# Patient Record
Sex: Male | Born: 1975 | Race: Black or African American | Hispanic: No | Marital: Single | Smoking: Never smoker
Health system: Southern US, Community
[De-identification: ages and names within clinical notes are randomized; demographics above are authoritative.]

## PROBLEM LIST (undated history)

## (undated) DIAGNOSIS — E785 Hyperlipidemia, unspecified: Secondary | ICD-10-CM

## (undated) DIAGNOSIS — E119 Type 2 diabetes mellitus without complications: Secondary | ICD-10-CM

## (undated) HISTORY — DX: Hyperlipidemia, unspecified: E78.5

## (undated) HISTORY — DX: Type 2 diabetes mellitus without complications: E11.9

---

## 2004-07-19 ENCOUNTER — Emergency Department: Payer: Self-pay | Admitting: Emergency Medicine

## 2005-02-08 ENCOUNTER — Emergency Department: Payer: Self-pay | Admitting: Emergency Medicine

## 2006-07-05 ENCOUNTER — Emergency Department: Payer: Self-pay | Admitting: Emergency Medicine

## 2009-07-07 ENCOUNTER — Ambulatory Visit: Payer: Self-pay | Admitting: Internal Medicine

## 2009-12-23 ENCOUNTER — Emergency Department: Payer: Self-pay | Admitting: Emergency Medicine

## 2011-09-05 ENCOUNTER — Emergency Department: Payer: Self-pay | Admitting: Emergency Medicine

## 2013-05-15 ENCOUNTER — Ambulatory Visit: Payer: Self-pay | Admitting: Family Medicine

## 2013-07-11 ENCOUNTER — Ambulatory Visit: Payer: Self-pay | Admitting: Adult Health

## 2013-09-03 ENCOUNTER — Ambulatory Visit: Payer: Self-pay | Admitting: Adult Health

## 2013-12-18 ENCOUNTER — Emergency Department: Payer: Self-pay | Admitting: Emergency Medicine

## 2013-12-18 LAB — URINALYSIS, COMPLETE
BILIRUBIN, UR: NEGATIVE
Bacteria: NONE SEEN
Glucose,UR: >=500 mg/dL (ref 0–75)
Leukocyte Esterase: NEGATIVE
Nitrite: NEGATIVE
Ph: 5 (ref 4.5–8.0)
Protein: NEGATIVE
RBC,UR: <1 /HPF (ref 0–5)
SPECIFIC GRAVITY: 1.037 (ref 1.003–1.030)
Squamous Epithelial: <1

## 2013-12-18 LAB — COMPREHENSIVE METABOLIC PANEL
ANION GAP: 4 — AB (ref 7–16)
Albumin: 4.3 g/dL (ref 3.4–5.0)
Alkaline Phosphatase: 102 U/L
BILIRUBIN TOTAL: 0.8 mg/dL (ref 0.2–1.0)
BUN: 19 mg/dL — ABNORMAL HIGH (ref 7–18)
CHLORIDE: 101 mmol/L (ref 98–107)
Calcium, Total: 8.4 mg/dL — ABNORMAL LOW (ref 8.5–10.1)
Co2: 27 mmol/L (ref 21–32)
Creatinine: 1.16 mg/dL (ref 0.60–1.30)
EGFR (African American): >60
Glucose: 344 mg/dL — ABNORMAL HIGH (ref 65–99)
Osmolality: 280 (ref 275–301)
POTASSIUM: 4.3 mmol/L (ref 3.5–5.1)
SGOT(AST): 27 U/L (ref 15–37)
SGPT (ALT): 54 U/L (ref 12–78)
SODIUM: 132 mmol/L — AB (ref 136–145)
Total Protein: 8.2 g/dL (ref 6.4–8.2)

## 2013-12-18 LAB — CBC
HCT: 47.5 % (ref 40.0–52.0)
HGB: 16.4 g/dL (ref 13.0–18.0)
MCH: 31.2 pg (ref 26.0–34.0)
MCHC: 34.5 g/dL (ref 32.0–36.0)
MCV: 90 fL (ref 80–100)
Platelet: 195 10*3/uL (ref 150–440)
RBC: 5.25 10*6/uL (ref 4.40–5.90)
RDW: 13.1 % (ref 11.5–14.5)
WBC: 12 10*3/uL — AB (ref 3.8–10.6)

## 2013-12-18 LAB — LIPASE, BLOOD: LIPASE: 106 U/L (ref 73–393)

## 2014-03-19 DIAGNOSIS — E139 Other specified diabetes mellitus without complications: Secondary | ICD-10-CM | POA: Insufficient documentation

## 2014-03-19 DIAGNOSIS — E1122 Type 2 diabetes mellitus with diabetic chronic kidney disease: Secondary | ICD-10-CM | POA: Insufficient documentation

## 2014-08-05 ENCOUNTER — Ambulatory Visit: Payer: Self-pay | Admitting: Internal Medicine

## 2015-05-19 ENCOUNTER — Encounter: Payer: Self-pay | Admitting: Family Medicine

## 2015-05-19 ENCOUNTER — Ambulatory Visit (INDEPENDENT_AMBULATORY_CARE_PROVIDER_SITE_OTHER): Payer: BLUE CROSS/BLUE SHIELD | Admitting: Family Medicine

## 2015-05-19 VITALS — BP 122/71 | HR 109 | Temp 98.3°F | Resp 18 | Ht 70.0 in | Wt 180.9 lb

## 2015-05-19 DIAGNOSIS — E785 Hyperlipidemia, unspecified: Secondary | ICD-10-CM

## 2015-05-19 DIAGNOSIS — E1165 Type 2 diabetes mellitus with hyperglycemia: Secondary | ICD-10-CM | POA: Diagnosis not present

## 2015-05-19 DIAGNOSIS — IMO0001 Reserved for inherently not codable concepts without codable children: Secondary | ICD-10-CM

## 2015-05-19 NOTE — Progress Notes (Signed)
Name: Keith Schmidt   MRN: [id number]    DOB: [birth year]   Date:05/19/2015       Progress Note  Subjective  Chief Complaint  Chief Complaint  Patient presents with  . Establish Care    NP (Dr. Carman Ching) f/u Diabetes  . Diabetes  . Hyperlipidemia    Diabetes He presents for his follow-up diabetic visit. He has type 2 diabetes mellitus. Pertinent negatives for diabetes include no chest pain. Current diabetic treatment includes oral agent (dual therapy). He is following a generally healthy diet. His lunch blood glucose range is generally >200 mg/dl. His dinner blood glucose range is generally >200 mg/dl. An ACE inhibitor/angiotensin II receptor blocker is not being taken.  Hyperlipidemia This is a chronic problem. The problem is uncontrolled. Recent lipid tests were reviewed and are high. Exacerbating diseases include diabetes. Pertinent negatives include no chest pain or myalgias. He is currently on no antihyperlipidemic treatment.      Past Medical History  Diagnosis Date  . Diabetes mellitus without complication   . Hyperlipidemia     History reviewed. No pertinent past surgical history.  History reviewed. No pertinent family history.  Social History   Social History  . Marital Status: Married    Spouse Name: N/A  . Number of Children: N/A  . Years of Education: N/A   Occupational History  . Not on file.   Social History Main Topics  . Smoking status: Never Smoker   . Smokeless tobacco: Never Used  . Alcohol Use: No  . Drug Use: No  . Sexual Activity:    Partners: Female   Other Topics Concern  . Not on file   Social History Narrative  . No narrative on file     Current outpatient prescriptions:  .  glipiZIDE (GLUCOTROL) 10 MG tablet, Take by mouth., Disp: , Rfl:  .  metFORMIN (GLUCOPHAGE) 1000 MG tablet, Take 1,000 mg by mouth., Disp: , Rfl:   Allergies no known allergies   Review of Systems  Constitutional: Negative for fever, chills and  malaise/fatigue.  Cardiovascular: Negative for chest pain and palpitations.  Musculoskeletal: Negative for myalgias.      Objective  Filed Vitals:   05/19/15 1352  BP: 122/71  Pulse: 109  Temp: 98.3 F (36.8 C)  TempSrc: Oral  Resp: 18  Height:  (1.778 m)  Weight: 180 lb 14.4 oz (82.056 kg)  SpO2: 97%    Physical Exam  Constitutional: He is oriented to person, place, and time and well-developed, well-nourished, and in no distress.  HENT:  Head: Normocephalic and atraumatic.  Cardiovascular: Normal rate and regular rhythm.   Pulmonary/Chest: Effort normal and breath sounds normal.  Abdominal: Soft. Bowel sounds are normal.  Neurological: He is alert and oriented to person, place, and time.  Skin: Skin is warm and dry.  Psychiatric: Memory, affect and judgment normal.  Nursing note and vitals reviewed.    Assessment & Plan  1. Diabetes mellitus type 2, uncontrolled, without complications Recheck A1c and adjust medications as necessary. - HgB A1c - Urine Microalbumin w/creat. ratio  2. Hyperlipidemia Patient was on a atorvastatin in the past, which was stopped due to elevation in liver enzymes. Recheck today and follow-up. - Lipid Profile - Comprehensive metabolic panel   Dania Marsan Asad [facility] 05/19/2015 1:59 PM

## 2015-06-03 LAB — LIPID PANEL
CHOL/HDL RATIO: 5.8 ratio — AB (ref 0.0–5.0)
Cholesterol, Total: 209 mg/dL — ABNORMAL HIGH (ref 100–199)
HDL: 36 mg/dL — ABNORMAL LOW (ref >39)
LDL Calculated: 159 mg/dL — ABNORMAL HIGH (ref 0–99)
TRIGLYCERIDES: 69 mg/dL (ref 0–149)
VLDL CHOLESTEROL CAL: 14 mg/dL (ref 5–40)

## 2015-06-03 LAB — COMPREHENSIVE METABOLIC PANEL
A/G RATIO: 1.8 (ref 1.1–2.5)
ALT: 45 IU/L — ABNORMAL HIGH (ref 0–44)
AST: 34 IU/L (ref 0–40)
Albumin: 4.6 g/dL (ref 3.5–5.5)
Alkaline Phosphatase: 64 IU/L (ref 39–117)
BILIRUBIN TOTAL: 0.7 mg/dL (ref 0.0–1.2)
BUN/Creatinine Ratio: 15 (ref 9–20)
BUN: 15 mg/dL (ref 6–24)
CALCIUM: 9.4 mg/dL (ref 8.7–10.2)
CHLORIDE: 97 mmol/L (ref 97–108)
CO2: 25 mmol/L (ref 18–29)
Creatinine, Ser: 0.97 mg/dL (ref 0.76–1.27)
GFR calc Af Amer: 112 mL/min/{1.73_m2} (ref >59)
GFR, EST NON AFRICAN AMERICAN: 97 mL/min/{1.73_m2} (ref >59)
Globulin, Total: 2.5 g/dL (ref 1.5–4.5)
Glucose: 204 mg/dL — ABNORMAL HIGH (ref 65–99)
POTASSIUM: 4.8 mmol/L (ref 3.5–5.2)
Sodium: 137 mmol/L (ref 134–144)
Total Protein: 7.1 g/dL (ref 6.0–8.5)

## 2015-06-03 LAB — MICROALBUMIN / CREATININE URINE RATIO
Creatinine, Urine: 192.1 mg/dL
MICROALB/CREAT RATIO: 9 mg/g creat (ref 0.0–30.0)
Microalbumin, Urine: 17.2 ug/mL

## 2015-06-03 LAB — HEMOGLOBIN A1C
Est. average glucose Bld gHb Est-mCnc: 278 mg/dL
Hgb A1c MFr Bld: 11.3 % — ABNORMAL HIGH (ref 4.8–5.6)

## 2015-06-07 ENCOUNTER — Telehealth: Payer: Self-pay | Admitting: Family Medicine

## 2015-06-07 MED ORDER — ATORVASTATIN CALCIUM 20 MG PO TABS: 20.0000 mg | ORAL_TABLET | Freq: Every day | ORAL | Status: DC

## 2015-06-07 NOTE — Telephone Encounter (Signed)
Per Dr. Sherryll Burger lipitor 20 mg has been refilled and sent to [facility] [address]

## 2015-06-07 NOTE — Addendum Note (Signed)
Addended bySherryll Burger, Nikoletta Varma A A on: 06/07/2015 03:53 PM   Modules accepted: Orders, SmartSet

## 2016-06-28 ENCOUNTER — Emergency Department: Payer: 59

## 2016-06-28 ENCOUNTER — Encounter: Payer: Self-pay | Admitting: Emergency Medicine

## 2016-06-28 ENCOUNTER — Emergency Department
Admission: EM | Admit: 2016-06-28 | Discharge: 2016-06-28 | Disposition: A | Discharge status: Home / Self Care | Payer: 59 | Attending: Emergency Medicine | Admitting: Emergency Medicine

## 2016-06-28 DIAGNOSIS — E119 Type 2 diabetes mellitus without complications: Secondary | ICD-10-CM | POA: Insufficient documentation

## 2016-06-28 DIAGNOSIS — Z7984 Long term (current) use of oral hypoglycemic drugs: Secondary | ICD-10-CM | POA: Insufficient documentation

## 2016-06-28 DIAGNOSIS — Z79899 Other long term (current) drug therapy: Secondary | ICD-10-CM | POA: Diagnosis not present

## 2016-06-28 DIAGNOSIS — R0789 Other chest pain: Secondary | ICD-10-CM | POA: Diagnosis not present

## 2016-06-28 DIAGNOSIS — R079 Chest pain, unspecified: Secondary | ICD-10-CM | POA: Diagnosis present

## 2016-06-28 LAB — CBC
HEMATOCRIT: 42.7 % (ref 40.0–52.0)
Hemoglobin: 14.8 g/dL (ref 13.0–18.0)
MCH: 30.5 pg (ref 26.0–34.0)
MCHC: 34.6 g/dL (ref 32.0–36.0)
MCV: 88.2 fL (ref 80.0–100.0)
Platelets: 215 10*3/uL (ref 150–440)
RBC: 4.85 MIL/uL (ref 4.40–5.90)
RDW: 13.1 % (ref 11.5–14.5)
WBC: 9.4 10*3/uL (ref 3.8–10.6)

## 2016-06-28 LAB — BASIC METABOLIC PANEL
Anion gap: 7 (ref 5–15)
BUN: 17 mg/dL (ref 6–20)
CHLORIDE: 106 mmol/L (ref 101–111)
CO2: 24 mmol/L (ref 22–32)
Calcium: 8.8 mg/dL — ABNORMAL LOW (ref 8.9–10.3)
Creatinine, Ser: 1.07 mg/dL (ref 0.61–1.24)
GFR calc Af Amer: >60 mL/min (ref >60)
GFR calc non Af Amer: >60 mL/min (ref >60)
Glucose, Bld: 255 mg/dL — ABNORMAL HIGH (ref 65–99)
POTASSIUM: 3.8 mmol/L (ref 3.5–5.1)
SODIUM: 137 mmol/L (ref 135–145)

## 2016-06-28 LAB — TROPONIN I: Troponin I: <0.03 ng/mL (ref <0.03)

## 2016-06-28 NOTE — ED Provider Notes (Signed)
[facility] Emergency Department Provider Note  ____________________________________________  Time seen: Approximately 6:26 PM  I have reviewed the triage vital signs and the nursing notes.   HISTORY  Chief Complaint Chest Pain    HPI Keith Schmidt is a 40 y.o. male who complains of chest pain for the past week. it's intermittent, lasting a few seconds or up to 2 minutes at a time, no aggravating or alleviating factors, not exertional, not pleuritic. Sharp. Nonradiating in the left side of the chest. No nausea vomiting or diaphoresis. He does occasionally have some shortness of breath with it.   Past Medical History:  Diagnosis Date  . Diabetes mellitus without complication (HCC)   . Hyperlipidemia      Patient Active Problem List   Diagnosis Date Noted  . Diabetes mellitus type 2, uncontrolled, without complications (HCC) 05/19/2015  . Hyperlipidemia 05/19/2015     History reviewed. No pertinent surgical history.   Prior to Admission medications   Medication Sig Start Date End Date Taking? Authorizing Provider  atorvastatin (LIPITOR) 20 MG tablet Take 1 tablet (20 mg total) by mouth at bedtime. 06/07/15   Ellyn Hack, MD  glipiZIDE (GLUCOTROL) 10 MG tablet Take by mouth. 09/23/14   Historical Provider, MD  metFORMIN (GLUCOPHAGE) 1000 MG tablet Take 1,000 mg by mouth.    Historical Provider, MD     Allergies Review of patient's allergies indicates no known allergies.   No family history on file. No early CAD Social History Social History  Substance Use Topics  . Smoking status: Never Smoker  . Smokeless tobacco: Never Used  . Alcohol use No    Review of Systems  Constitutional:   No fever or chills.  ENT:   No sore throat. No rhinorrhea. Cardiovascular:   Positive as above chest pain. Respiratory:   No dyspnea or cough. Gastrointestinal:   Negative for abdominal pain, vomiting and diarrhea.   10-point ROS otherwise  negative.  ____________________________________________   PHYSICAL EXAM:  VITAL SIGNS: ED Triage Vitals  Enc Vitals Group     BP 06/28/16 1708 114/64     Pulse Rate 06/28/16 1708 78     Resp 06/28/16 1708 18     Temp 06/28/16 1708 98.1 F (36.7 C)     Temp Source 06/28/16 1708 Oral     SpO2 06/28/16 1708 98 %     Weight 06/28/16 1709 175 lb (79.4 kg)     Height 06/28/16 1709 5\' 8"  (1.727 m)     Head Circumference --      Peak Flow --      Pain Score 06/28/16 1709 5     Pain Loc --      Pain Edu? --      Excl. in GC? --     Vital signs reviewed, nursing assessments reviewed.   Constitutional:   Alert and oriented. Well appearing and in no distress. Eyes:   No scleral icterus. No conjunctival pallor. PERRL. EOMI.  No nystagmus. ENT   Head:   Normocephalic and atraumatic.   Nose:   No congestion/rhinnorhea. No septal hematoma   Mouth/Throat:   MMM, no pharyngeal erythema. No peritonsillar mass.    Neck:   No stridor. No SubQ emphysema. No meningismus. Hematological/Lymphatic/Immunilogical:   No cervical lymphadenopathy. Cardiovascular:   RRR. Symmetric bilateral radial and DP pulses.  No murmurs.  Respiratory:   Normal respiratory effort without tachypnea nor retractions. Breath sounds are clear and equal bilaterally. No wheezes/rales/rhonchi.Chest  wall nontender Gastrointestinal:   Soft and nontender. Non distended. There is no CVA tenderness.  No rebound, rigidity, or guarding. Genitourinary:   deferred Musculoskeletal:   Nontender with normal range of motion in all extremities. No joint effusions.  No lower extremity tenderness.  No edema. Neurologic:   Normal speech and language.  CN 2-10 normal. Motor grossly intact. No gross focal neurologic deficits are appreciated.  Skin:    Skin is warm, dry and intact. No rash noted.  No petechiae, purpura, or bullae.  ____________________________________________    LABS (pertinent positives/negatives) (all  labs ordered are listed, but only abnormal results are displayed) Labs Reviewed  BASIC METABOLIC PANEL - Abnormal; Notable for the following:       Result Value   Glucose, Bld 255 (*)    Calcium 8.8 (*)    All other components within normal limits  CBC  TROPONIN I   ____________________________________________   EKG  Interpreted by me  Date: 06/28/2016  Rate: 61  Rhythm: normal sinus rhythm  QRS Axis: normal  Intervals: normal  ST/T Wave abnormalities: normal  Conduction Disutrbances: none  Narrative Interpretation: unremarkable      ____________________________________________    RADIOLOGY  Chest x-ray unremarkable  ____________________________________________   PROCEDURES Procedures  ____________________________________________   INITIAL IMPRESSION / ASSESSMENT AND PLAN / ED COURSE  Pertinent labs & imaging results that were available during my care of the patient were reviewed by me and considered in my medical decision making (see chart for details).  Patient is well-appearing no acute distress, presents with atypical chest pain.Considering the patient's symptoms, medical history, and physical examination today, I have low suspicion for ACS, PE, TAD, pneumothorax, carditis, mediastinitis, pneumonia, CHF, or sepsis.  Although his presentation is not consistent with ACS and chest pains currently resolved, troponin negative, EKG unremarkable, due to his risk factors of hyperlipidemia and diabetes I encouraged the patient follow up with cardiology within a week for further assessment risk stratification and potential testing. Does not warrant hospitalization at this time.     Clinical Course   ____________________________________________   FINAL CLINICAL IMPRESSION(S) / ED DIAGNOSES  Final diagnoses:  Atypical chest pain       Portions of this note were generated with dragon dictation software. Dictation errors may occur despite best attempts at  proofreading.    Sharman CheekPhillip Arla Boutwell, MD 06/28/16 [phone]

## 2016-06-28 NOTE — ED Triage Notes (Signed)
Pt presents with non radiating left side chest pain for approximately one week.

## 2017-01-22 ENCOUNTER — Encounter: Payer: Self-pay | Admitting: Family Medicine

## 2017-01-22 ENCOUNTER — Ambulatory Visit (INDEPENDENT_AMBULATORY_CARE_PROVIDER_SITE_OTHER): Payer: 59 | Admitting: Family Medicine

## 2017-01-22 VITALS — BP 116/72 | HR 75 | Temp 98.7°F | Ht 68.5 in | Wt 172.7 lb

## 2017-01-22 DIAGNOSIS — E782 Mixed hyperlipidemia: Secondary | ICD-10-CM

## 2017-01-22 DIAGNOSIS — K649 Unspecified hemorrhoids: Secondary | ICD-10-CM | POA: Diagnosis not present

## 2017-01-22 DIAGNOSIS — IMO0001 Reserved for inherently not codable concepts without codable children: Secondary | ICD-10-CM

## 2017-01-22 DIAGNOSIS — E1165 Type 2 diabetes mellitus with hyperglycemia: Secondary | ICD-10-CM | POA: Diagnosis not present

## 2017-01-22 DIAGNOSIS — Z7689 Persons encountering health services in other specified circumstances: Secondary | ICD-10-CM | POA: Diagnosis not present

## 2017-01-22 DIAGNOSIS — R350 Frequency of micturition: Secondary | ICD-10-CM

## 2017-01-22 DIAGNOSIS — Z23 Encounter for immunization: Secondary | ICD-10-CM

## 2017-01-22 LAB — UA/M W/RFLX CULTURE, ROUTINE
Bilirubin, UA: NEGATIVE
Leukocytes, UA: NEGATIVE
Nitrite, UA: NEGATIVE
RBC, UA: NEGATIVE
Specific Gravity, UA: 1.025 (ref 1.005–1.030)
Urobilinogen, Ur: 0.2 mg/dL (ref 0.2–1.0)
pH, UA: 5 (ref 5.0–7.5)

## 2017-01-22 LAB — MICROSCOPIC EXAMINATION
Bacteria, UA: NONE SEEN
RBC, UA: NONE SEEN /hpf (ref 0–?)
WBC, UA: NONE SEEN /hpf (ref 0–?)

## 2017-01-22 LAB — BAYER DCA HB A1C WAIVED: HB A1C (BAYER DCA - WAIVED): >14 % — ABNORMAL HIGH (ref <7.0)

## 2017-01-22 MED ORDER — HYDROCORTISONE 2.5 % RE CREA: 1.0000 "application " | TOPICAL_CREAM | Freq: Two times a day (BID) | RECTAL | 1 refills | Status: DC

## 2017-01-22 MED ORDER — GLIPIZIDE 10 MG PO TABS: 10.0000 mg | ORAL_TABLET | Freq: Every day | ORAL | 3 refills | Status: DC

## 2017-01-22 MED ORDER — ATORVASTATIN CALCIUM 20 MG PO TABS: 20.0000 mg | ORAL_TABLET | Freq: Every day | ORAL | 3 refills | Status: DC

## 2017-01-22 MED ORDER — METFORMIN HCL 1000 MG PO TABS: 1000.0000 mg | ORAL_TABLET | Freq: Two times a day (BID) | ORAL | 3 refills | Status: AC

## 2017-01-22 MED ORDER — GLIPIZIDE 10 MG PO TABS: 10.0000 mg | ORAL_TABLET | Freq: Two times a day (BID) | ORAL | 3 refills | Status: AC

## 2017-01-22 NOTE — Progress Notes (Addendum)
BP 116/72 (BP Location: Right Arm, Patient Position: Sitting, Cuff Size: Normal)   Pulse 75   Temp 98.7 F (37.1 C)   Ht 5' 8.5" (1.74 m)   Wt 172 lb 11.2 oz (78.3 kg)   SpO2 98%   BMI 25.88 kg/m    Subjective:    Patient ID: Keith Schmidt, male    DOB: [birth year], 41 y.o.   MRN: [id number]  HPI: Keith Schmidt is a 41 y.o. male  Chief Complaint  Patient presents with  . Establish Care  . Urinary Frequency    x's 1 year, 2 months.  . Joint Pain    Both hands. x's 1 year.   . Foot Pain    Left foot. Cramping. x's 3 months.    Patient presents today to establish care. Last visit with previous PCP was back in September. Was given refills on all medications at this visit which he was on for a month, but then never refilled them after that. Has been off everything x 6 months. A1c has consistently been above 11. 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   Urinary frequency - 4 or 5 times per night, and 7-8 times per day. Hx of prostate infections x 2 (age 81 and age 61), and fhx of prostate cancer. States he is having rectal pain x 1 week, but also recently noticed a new mass on anus so wondering whether it's his prostate or something more superficial. Aches when sitting and with BMs. Has not tried anything OTC for relief. No hematuria, dysuria, fevers, chills. No hx of trauma, hemorrhoids, fissures, or other anal issues. Does admit to irregular BMs and occasional straining. Denies bloody stools.   Relevant past medical, surgical, family and social history reviewed and updated as indicated. Interim medical history since our last visit reviewed. Allergies and medications reviewed and updated.  Review of Systems  Constitutional: Negative.   HENT: Negative.   Eyes: Negative.   Respiratory: Negative.   Cardiovascular: Negative.   Gastrointestinal: Positive for constipation and rectal pain.  Endocrine:  Positive for polyuria.  Genitourinary: Negative.   Musculoskeletal: Negative.   Skin: Negative.   Neurological: Negative.   Psychiatric/Behavioral: Negative.     Per HPI unless specifically indicated above     Objective:    BP 116/72 (BP Location: Right Arm, Patient Position: Sitting, Cuff Size: Normal)   Pulse 75   Temp 98.7 F (37.1 C)   Ht 5' 8.5" (1.74 m)   Wt 172 lb 11.2 oz (78.3 kg)   SpO2 98%   BMI 25.88 kg/m   Wt Readings from Last 3 Encounters:  01/22/17 172 lb 11.2 oz (78.3 kg)  06/28/16 175 lb (79.4 kg)  05/19/15 180 lb 14.4 oz (82.1 kg)    Physical Exam  Constitutional: He is oriented to person, place, and time. He appears well-developed and well-nourished. No distress.  HENT:  Head: Atraumatic.  Eyes: Conjunctivae are normal. Pupils are equal, round, and reactive to light. No scleral icterus.  Neck: Normal range of motion. Neck supple.  Cardiovascular: Normal rate and normal heart sounds.   Pulmonary/Chest: Effort normal and breath sounds normal. No respiratory distress.  Abdominal: Soft. Bowel sounds are normal.  Genitourinary: Prostate normal.  Genitourinary Comments: Inflamed hemorrhoid at 9' o clock, non-thrombosed, no fissure noted  Prostate non-tender to palpation  Musculoskeletal: Normal range of motion.  Neurological: He is alert and  oriented to person, place, and time.  Skin:  Significant, diffuse ichthyosis    Psychiatric: He has a normal mood and affect. His behavior is normal.  Nursing note and vitals reviewed.     Assessment & Plan:   Problem List Items Addressed This Visit      Endocrine   Diabetes mellitus type 2, uncontrolled, without complications (HCC)    A1C > 14 today. Restart metformin and glipizide, discussed diet and exercise, endocrinology referral placed. Pt aware that it is incredibly important to be compliant with these medications and to keep this appointment in order to get his sugars under control. Suspect his urinary  symptoms are d/t severely uncontrolled dz.        Relevant Medications   metFORMIN (GLUCOPHAGE) 1000 MG tablet   atorvastatin (LIPITOR) 20 MG tablet   glipiZIDE (GLUCOTROL) 10 MG tablet   Other Relevant Orders   UA/M w/rflx Culture, Routine (Completed)   Bayer DCA Hb A1c Waived (Completed)   Ambulatory referral to Endocrinology     Other   Hyperlipidemia    Restart atorvastatin, f/u for fasting lipid check in 6 months. Discussed lifestyle modifications.       Relevant Medications   atorvastatin (LIPITOR) 20 MG tablet   Other Relevant Orders   Lipid Panel w/o Chol/HDL Ratio (Completed)    Other Visit Diagnoses    Encounter to establish care    -  Primary   Need for tetanus, diphtheria, and acellular pertussis (Tdap) vaccine       Relevant Orders   Tdap vaccine greater than or equal to 7yo IM (Completed)   Urinary frequency       Await PSA given fhx, U/A without evidence of infection, prostate exam normal. Suspect related to uncontrolled blood sugars.    Relevant Orders   PSA (Completed)   Hemorrhoids, unspecified hemorrhoid type       Anusol sent, discussed miralax and fiber daily to keep stools soft, avoid straining. Tucks pads, sitz baths. F/u if worsening or no improvement.    Relevant Medications   atorvastatin (LIPITOR) 20 MG tablet       Follow up plan: Return in about 6 months (around 07/24/2017) for Physical Exam, HLD.

## 2017-01-22 NOTE — Patient Instructions (Signed)
Follow up in 6 months 

## 2017-01-23 LAB — LIPID PANEL W/O CHOL/HDL RATIO
Cholesterol, Total: 220 mg/dL — ABNORMAL HIGH (ref 100–199)
HDL: 32 mg/dL — ABNORMAL LOW (ref >39)
LDL CALC: 166 mg/dL — AB (ref 0–99)
Triglycerides: 111 mg/dL (ref 0–149)
VLDL Cholesterol Cal: 22 mg/dL (ref 5–40)

## 2017-01-23 LAB — PSA: Prostate Specific Ag, Serum: 1.5 ng/mL (ref 0.0–4.0)

## 2017-01-24 NOTE — Assessment & Plan Note (Signed)
Restart atorvastatin, f/u for fasting lipid check in 6 months. Discussed lifestyle modifications.

## 2017-01-24 NOTE — Assessment & Plan Note (Signed)
A1C > 14 today. Restart metformin and glipizide, discussed diet and exercise, endocrinology referral placed. Pt aware that it is incredibly important to be compliant with these medications and to keep this appointment in order to get his sugars under control. Suspect his urinary symptoms are d/t severely uncontrolled dz.

## 2017-07-30 ENCOUNTER — Encounter: Payer: Self-pay | Admitting: Family Medicine

## 2017-08-15 ENCOUNTER — Emergency Department: Payer: 59

## 2017-08-15 ENCOUNTER — Emergency Department
Admission: EM | Admit: 2017-08-15 | Discharge: 2017-08-15 | Disposition: A | Discharge status: Home / Self Care | Payer: Self-pay | Attending: Emergency Medicine | Admitting: Emergency Medicine

## 2017-08-15 DIAGNOSIS — E119 Type 2 diabetes mellitus without complications: Secondary | ICD-10-CM | POA: Insufficient documentation

## 2017-08-15 DIAGNOSIS — M722 Plantar fascial fibromatosis: Secondary | ICD-10-CM | POA: Insufficient documentation

## 2017-08-15 DIAGNOSIS — Z7984 Long term (current) use of oral hypoglycemic drugs: Secondary | ICD-10-CM | POA: Insufficient documentation

## 2017-08-15 DIAGNOSIS — Z79899 Other long term (current) drug therapy: Secondary | ICD-10-CM | POA: Insufficient documentation

## 2017-08-15 LAB — GLUCOSE, CAPILLARY: Glucose-Capillary: 363 mg/dL — ABNORMAL HIGH (ref 65–99)

## 2017-08-15 MED ORDER — MELOXICAM 15 MG PO TABS: 15.0000 mg | ORAL_TABLET | Freq: Every day | ORAL | 0 refills | Status: DC

## 2017-08-15 MED ORDER — HYDROCODONE-ACETAMINOPHEN 5-325 MG PO TABS: 1.0000 | ORAL_TABLET | ORAL | 0 refills | Status: AC | PRN

## 2017-08-15 NOTE — ED Notes (Signed)
Left foot pain x3 days, no injury

## 2017-08-15 NOTE — ED Provider Notes (Signed)
[facility] Emergency Department Provider Note ____________________________________________  Time seen: Approximately 6:14 PM  I have reviewed the triage vital signs and the nursing notes.   HISTORY  Chief Complaint Foot Pain    HPI Nobie PutnamKenneth J Niccoli is a 41 y.o. male who presents to the emergency department for evaluation and treatment of the left foot pain.Pain started approximately 3 days ago. No specific injury. He does work in a [employer] and walks many hours on concrete floors. He states he has been walking on the ball of his foot to avoid putting any weight on the heel. Pain is described as a throbbing, dull, and occasionally sharp pain. He does have a significant past medical history of type 2 diabetes which has been uncontrolled for some time. He reports that his last A1c was 14. He reports being compliant with his medications.  Past Medical History:  Diagnosis Date  . Diabetes mellitus without complication (HCC)   . Hyperlipidemia     Patient Active Problem List   Diagnosis Date Noted  . Diabetes mellitus type 2, uncontrolled, without complications (HCC) 05/19/2015  . Hyperlipidemia 05/19/2015    No past surgical history on file.  Prior to Admission medications   Medication Sig Start Date End Date Taking? Authorizing Provider  atorvastatin (LIPITOR) 20 MG tablet Take 1 tablet (20 mg total) by mouth at bedtime. 01/22/17   Particia NearingLane, Rachel Elizabeth, PA-C  glipiZIDE (GLUCOTROL) 10 MG tablet Take 1 tablet (10 mg total) by mouth 2 (two) times daily before a meal. 01/22/17   Particia NearingLane, Rachel Elizabeth, PA-C  HYDROcodone-acetaminophen (NORCO/VICODIN) 5-325 MG tablet Take 1 tablet every 4 (four) hours as needed by mouth for moderate pain. 08/15/17 08/15/18  Macil Crady, Kasandra Knudsenari B, FNP  hydrocortisone (ANUSOL-HC) 2.5 % rectal cream Place 1 application rectally 2 (two) times daily. 01/22/17   Particia NearingLane, Rachel Elizabeth, PA-C  meloxicam (MOBIC) 15 MG tablet Take 1 tablet (15  mg total) daily by mouth. 08/15/17   Chinita Pesterriplett, Emmaline Wahba B, FNP  metFORMIN (GLUCOPHAGE) 1000 MG tablet Take 1 tablet (1,000 mg total) by mouth 2 (two) times daily with a meal. 01/22/17   Particia NearingLane, Rachel Elizabeth, PA-C    Allergies Atorvastatin  Family History  Problem Relation Age of Onset  . Diabetes Mother   . Depression Maternal Aunt   . Depression Maternal Uncle   . Depression Maternal Grandmother   . Arthritis Maternal Grandmother   . Birth defects Maternal Grandmother        Pancreatic  . Heart disease Maternal Grandfather   . Depression Paternal Grandmother   . Arthritis Paternal Grandmother     Social History Social History   Tobacco Use  . Smoking status: Never Smoker  . Smokeless tobacco: Never Used  Substance Use Topics  . Alcohol use: No    Alcohol/week: 0.0 oz  . Drug use: No    Review of Systems Constitutional: Negative for fever, recent injury or illness. Cardiovascular: Negative for chest pain. Respiratory: Negative for shortness of breath. Musculoskeletal: Positive for left foot pain. Skin: Negative for rash, lesion, or when.  Neurological: Negative for change in cognition or mentation.  ____________________________________________   PHYSICAL EXAM:  VITAL SIGNS: ED Triage Vitals [08/15/17 1808]  Enc Vitals Group     BP (!) 124/94     Pulse Rate 69     Resp 16     Temp 98 F (36.7 C)     Temp src      SpO2 100 %  Weight      Height      Head Circumference      Peak Flow      Pain Score      Pain Loc      Pain Edu?      Excl. in GC?     Constitutional: Alert and oriented. Well appearing and in no acute distress. Eyes: Conjunctivae are clear without discharge or drainage.  Head: Atraumatic Neck: Full, active range of motion observed. Respiratory: Respirations are even and unlabored. Musculoskeletal: No obvious bony abnormality of the left foot or ankle. Patient has full range of motion of toes of the left foot as well as ankle. Tenderness  upon deep palpation of the left calcaneus. Neurologic: Awake, alert, oriented 4.  Skin: Warm, dry, and intact. No lesions or wounds noted over the left foot or toes.  Psychiatric: Mood, affect, and behavior are normal and appropriate.  ____________________________________________   LABS (all labs ordered are listed, but only abnormal results are displayed)  Labs Reviewed  GLUCOSE, CAPILLARY - Abnormal; Notable for the following components:      Result Value   Glucose-Capillary 363 (*)    All other components within normal limits   ____________________________________________  RADIOLOGY  Image of the left foot is negative for acute bony abnormality. ____________________________________________   PROCEDURES  Procedure(s) performed: None  ____________________________________________   INITIAL IMPRESSION / ASSESSMENT AND PLAN / ED COURSE  Nobie PutnamKenneth J Berninger is a 41 y.o. male who presents to the emergency department for treatment and evaluation of nontraumatic left foot pain started 3 days ago. Symptoms most consistent with plantar fasciitis, however x-rays have been requested to look for subtle/stress fracture or foreign body.  ----------------------------------------- 7:30 PM on 08/15/2017 ----------------------------------------- X-ray negative for acute bony abnormality and no foreign bodies identified. Patient will be discharged home with prescriptions for meloxicam and a short course of Norco. He was also instructed to rest and elevate his foot as often as possible for the next 3 days and a work note was provided. He was instructed to follow-up with podiatry for both his diabetes and acute pain. He was advised to return to the emergency department for symptoms that change or worsen if he is unable to see his primary care provider or the specialist.   Pertinent labs & imaging results that were available during my care of the patient were reviewed by me and considered in my  medical decision making (see chart for details).  _________________________________________   FINAL CLINICAL IMPRESSION(S) / ED DIAGNOSES  Final diagnoses:  Plantar fasciitis of left foot    If controlled substance prescribed during this visit, 12 month history viewed on the [facility] prior to issuing an initial prescription for Schedule II or III opiod.    Chinita Pesterriplett, Thressa Shiffer B, FNP 08/15/17 2021    Dionne BucySiadecki, Sebastian, MD 08/15/17 2316

## 2017-08-15 NOTE — Discharge Instructions (Signed)
Please follow-up with podiatry if you're not improving over the week. Return to emergency department for symptoms that change or worsen if you're unable schedule an appointment.

## 2017-08-15 NOTE — ED Notes (Signed)
Pt presents today with  Foot pain. Pt is a diabetic and has had this foot pain since Sunday. He currently is only taking OTC for the pain. Pt is A/O wife at bedside. Awaiting EDP.

## 2017-12-26 ENCOUNTER — Other Ambulatory Visit: Payer: Self-pay

## 2017-12-26 ENCOUNTER — Emergency Department
Admission: EM | Admit: 2017-12-26 | Discharge: 2017-12-26 | Disposition: A | Discharge status: Home / Self Care | Payer: Self-pay | Attending: Emergency Medicine | Admitting: Emergency Medicine

## 2017-12-26 DIAGNOSIS — Z7984 Long term (current) use of oral hypoglycemic drugs: Secondary | ICD-10-CM | POA: Insufficient documentation

## 2017-12-26 DIAGNOSIS — E119 Type 2 diabetes mellitus without complications: Secondary | ICD-10-CM | POA: Insufficient documentation

## 2017-12-26 DIAGNOSIS — H66001 Acute suppurative otitis media without spontaneous rupture of ear drum, right ear: Secondary | ICD-10-CM | POA: Insufficient documentation

## 2017-12-26 DIAGNOSIS — Z79899 Other long term (current) drug therapy: Secondary | ICD-10-CM | POA: Insufficient documentation

## 2017-12-26 MED ORDER — AMOXICILLIN 500 MG PO CAPS: 500.0000 mg | ORAL_CAPSULE | Freq: Three times a day (TID) | ORAL | 0 refills | Status: AC

## 2017-12-26 MED ORDER — PREDNISONE 50 MG PO TABS: ORAL_TABLET | ORAL | 0 refills | Status: DC

## 2017-12-26 NOTE — ED Triage Notes (Signed)
Right ear pain and decreased hearing X 1 week .Pt alert and oriented X4, active, cooperative, pt in NAD. RR even and unlabored, color WNL.

## 2017-12-26 NOTE — ED Notes (Signed)
See triage note  Presents with pain and decreased hearing to right ear  States he thought he had an ear infection about 1 week ago   But now noticed decreased hearing

## 2017-12-26 NOTE — ED Provider Notes (Signed)
[facility] Emergency Department Provider Note  ____________________________________________  Time seen: Approximately 6:12 PM  I have reviewed the triage vital signs and the nursing notes.   HISTORY  Chief Complaint Otalgia    HPI Keith Schmidt is a 42 y.o. male presents to the emergency department with 8 out of 10, nonradiating right ear pain for the past 5 days.  Patient reports that he has not had an ear infection since childhood.  He denies discharge from the ear.  Patient reports no fever or chills.  Patient reports that he has noticed a loss of hearing.  No alleviating measures have been attempted.  Past Medical History:  Diagnosis Date  . Diabetes mellitus without complication (HCC)   . Hyperlipidemia     Patient Active Problem List   Diagnosis Date Noted  . Diabetes mellitus type 2, uncontrolled, without complications (HCC) 05/19/2015  . Hyperlipidemia 05/19/2015    History reviewed. No pertinent surgical history.  Prior to Admission medications   Medication Sig Start Date End Date Taking? Authorizing Provider  amoxicillin (AMOXIL) 500 MG capsule Take 1 capsule (500 mg total) by mouth 3 (three) times daily for 10 days. 12/26/17 01/05/18  Orvil FeilWoods, Jaclyn M, PA-C  atorvastatin (LIPITOR) 20 MG tablet Take 1 tablet (20 mg total) by mouth at bedtime. 01/22/17   Particia NearingLane, Rachel Elizabeth, PA-C  glipiZIDE (GLUCOTROL) 10 MG tablet Take 1 tablet (10 mg total) by mouth 2 (two) times daily before a meal. 01/22/17   Particia NearingLane, Rachel Elizabeth, PA-C  HYDROcodone-acetaminophen (NORCO/VICODIN) 5-325 MG tablet Take 1 tablet every 4 (four) hours as needed by mouth for moderate pain. 08/15/17 08/15/18  Triplett, Kasandra Knudsenari B, FNP  hydrocortisone (ANUSOL-HC) 2.5 % rectal cream Place 1 application rectally 2 (two) times daily. 01/22/17   Particia NearingLane, Rachel Elizabeth, PA-C  meloxicam (MOBIC) 15 MG tablet Take 1 tablet (15 mg total) daily by mouth. 08/15/17   Triplett, Rulon Eisenmengerari B, FNP   metFORMIN (GLUCOPHAGE) 1000 MG tablet Take 1 tablet (1,000 mg total) by mouth 2 (two) times daily with a meal. 01/22/17   Particia NearingLane, Rachel Elizabeth, PA-C  predniSONE (DELTASONE) 50 MG tablet Take one 50 mg tablet once daily for 5 days. 12/26/17   Orvil FeilWoods, Jaclyn M, PA-C    Allergies Atorvastatin  Family History  Problem Relation Age of Onset  . Diabetes Mother   . Depression Maternal Aunt   . Depression Maternal Uncle   . Depression Maternal Grandmother   . Arthritis Maternal Grandmother   . Birth defects Maternal Grandmother        Pancreatic  . Heart disease Maternal Grandfather   . Depression Paternal Grandmother   . Arthritis Paternal Grandmother     Social History Social History   Tobacco Use  . Smoking status: Never Smoker  . Smokeless tobacco: Never Used  Substance Use Topics  . Alcohol use: No    Alcohol/week: 0.0 oz  . Drug use: No     Review of Systems  Constitutional: No fever/chills Eyes: No visual changes. No discharge ENT: Patient has right otalgia. Cardiovascular: no chest pain. Respiratory: no cough. No SOB. Gastrointestinal: No abdominal pain.  No nausea, no vomiting.  No diarrhea.  No constipation. Musculoskeletal: Negative for musculoskeletal pain. Skin: Negative for rash, abrasions, lacerations, ecchymosis. Neurological: Negative for headaches, focal weakness or numbness. 10-point ROS otherwise negative. ____________________________________________   PHYSICAL EXAM:  VITAL SIGNS: ED Triage Vitals  Enc Vitals Group     BP 12/26/17 1737 111/74  Pulse Rate 12/26/17 1737 73     Resp --      Temp 12/26/17 1737 98.3 F (36.8 C)     Temp Source 12/26/17 1737 Oral     SpO2 12/26/17 1737 97 %     Weight 12/26/17 1737 174 lb (78.9 kg)     Height 12/26/17 1737 5\' 8"  (1.727 m)     Head Circumference --      Peak Flow --      Pain Score 12/26/17 1745 5     Pain Loc --      Pain Edu? --      Excl. in GC? --      Constitutional: Alert and  oriented. Well appearing and in no acute distress. Eyes: Conjunctivae are normal. PERRL. EOMI. Head: Atraumatic. ENT:      Ears: Right tympanic membrane is erythematous, bulging and effused.      Nose: No congestion/rhinnorhea.      Mouth/Throat: Mucous membranes are moist.  Hematological/Lymphatic/Immunilogical: No cervical lymphadenopathy. Cardiovascular: Normal rate, regular rhythm. Normal S1 and S2.  Good peripheral circulation. Respiratory: Normal respiratory effort without tachypnea or retractions. Lungs CTAB. Good air entry to the bases with no decreased or absent breath sounds. Skin:  Skin is warm, dry and intact. No rash noted.  ____________________________________________   LABS (all labs ordered are listed, but only abnormal results are displayed)  Labs Reviewed - No data to display ____________________________________________  EKG   ____________________________________________  RADIOLOGY   No results found.  ____________________________________________    PROCEDURES  Procedure(s) performed:    Procedures    Medications - No data to display   ____________________________________________   INITIAL IMPRESSION / ASSESSMENT AND PLAN / ED COURSE  Pertinent labs & imaging results that were available during my care of the patient were reviewed by me and considered in my medical decision making (see chart for details).  Review of the [place] CSRS was performed in accordance of the [facility] prior to dispensing any controlled drugs.     Assessment and plan Otitis media Patient presents to the emergency department with right otalgia for approximately 1 week.  Differential diagnosis included otitis externa and otitis media.  On physical exam, patient had otitis media with effusion.  Patient was discharged with amoxicillin and advised to use Flonase at the same time.  Patient was also given a prescription for prednisone but advised to only start prednisone if hearing  loss does not improve secondary to persistent middle ear effusion.     ____________________________________________  FINAL CLINICAL IMPRESSION(S) / ED DIAGNOSES  Final diagnoses:  Acute suppurative otitis media of right ear without spontaneous rupture of tympanic membrane, recurrence not specified      NEW MEDICATIONS STARTED DURING THIS VISIT:  ED Discharge Orders        Ordered    amoxicillin (AMOXIL) 500 MG capsule  3 times daily     12/26/17 1810    predniSONE (DELTASONE) 50 MG tablet     12/26/17 1810          This chart was dictated using voice recognition software/Dragon. Despite best efforts to proofread, errors can occur which can change the meaning. Any change was purely unintentional.    Orvil Feil, PA-C 12/26/17 1816    Rockne Menghini, MD 12/26/17 [phone]

## 2018-09-17 ENCOUNTER — Emergency Department
Admission: EM | Admit: 2018-09-17 | Discharge: 2018-09-17 | Disposition: A | Discharge status: Home / Self Care | Payer: Self-pay | Attending: Student in an Organized Health Care Education/Training Program | Admitting: Student in an Organized Health Care Education/Training Program

## 2018-09-17 ENCOUNTER — Encounter: Payer: Self-pay | Admitting: Emergency Medicine

## 2018-09-17 ENCOUNTER — Other Ambulatory Visit: Payer: Self-pay

## 2018-09-17 DIAGNOSIS — R739 Hyperglycemia, unspecified: Secondary | ICD-10-CM

## 2018-09-17 DIAGNOSIS — H538 Other visual disturbances: Secondary | ICD-10-CM | POA: Insufficient documentation

## 2018-09-17 DIAGNOSIS — R110 Nausea: Secondary | ICD-10-CM | POA: Insufficient documentation

## 2018-09-17 DIAGNOSIS — R5383 Other fatigue: Secondary | ICD-10-CM | POA: Insufficient documentation

## 2018-09-17 DIAGNOSIS — Z79899 Other long term (current) drug therapy: Secondary | ICD-10-CM | POA: Insufficient documentation

## 2018-09-17 DIAGNOSIS — E1165 Type 2 diabetes mellitus with hyperglycemia: Secondary | ICD-10-CM | POA: Insufficient documentation

## 2018-09-17 DIAGNOSIS — Z7984 Long term (current) use of oral hypoglycemic drugs: Secondary | ICD-10-CM | POA: Insufficient documentation

## 2018-09-17 LAB — BASIC METABOLIC PANEL
ANION GAP: 6 (ref 5–15)
BUN: 15 mg/dL (ref 6–20)
CHLORIDE: 106 mmol/L (ref 98–111)
CO2: 24 mmol/L (ref 22–32)
Calcium: 8.7 mg/dL — ABNORMAL LOW (ref 8.9–10.3)
Creatinine, Ser: 0.93 mg/dL (ref 0.61–1.24)
GFR calc non Af Amer: >60 mL/min (ref >60)
Glucose, Bld: 290 mg/dL — ABNORMAL HIGH (ref 70–99)
POTASSIUM: 4.2 mmol/L (ref 3.5–5.1)
SODIUM: 136 mmol/L (ref 135–145)

## 2018-09-17 LAB — CBC
HEMATOCRIT: 46.2 % (ref 39.0–52.0)
HEMOGLOBIN: 15.6 g/dL (ref 13.0–17.0)
MCH: 30.4 pg (ref 26.0–34.0)
MCHC: 33.8 g/dL (ref 30.0–36.0)
MCV: 90.1 fL (ref 80.0–100.0)
NRBC: 0 % (ref 0.0–0.2)
Platelets: 261 10*3/uL (ref 150–400)
RBC: 5.13 MIL/uL (ref 4.22–5.81)
RDW: 12.5 % (ref 11.5–15.5)
WBC: 7.4 10*3/uL (ref 4.0–10.5)

## 2018-09-17 LAB — URINALYSIS, COMPLETE (UACMP) WITH MICROSCOPIC
BILIRUBIN URINE: NEGATIVE
Bacteria, UA: NONE SEEN
Glucose, UA: >=500 mg/dL — AB
Hgb urine dipstick: NEGATIVE
KETONES UR: NEGATIVE mg/dL
LEUKOCYTES UA: NEGATIVE
NITRITE: NEGATIVE
PH: 5 (ref 5.0–8.0)
PROTEIN: NEGATIVE mg/dL
SQUAMOUS EPITHELIAL / LPF: NONE SEEN (ref 0–5)
Specific Gravity, Urine: 1.022 (ref 1.005–1.030)

## 2018-09-17 LAB — GLUCOSE, CAPILLARY
Glucose-Capillary: 265 mg/dL — ABNORMAL HIGH (ref 70–99)
Glucose-Capillary: 73 mg/dL (ref 70–99)

## 2018-09-17 MED ORDER — INSULIN ASPART 100 UNIT/ML ~~LOC~~ SOLN
6.0000 [IU] | Freq: Once | SUBCUTANEOUS | Status: AC
  Administered 2018-09-17: 6 [IU] via INTRAVENOUS

## 2018-09-17 MED ORDER — SODIUM CHLORIDE 0.9 % IV BOLUS
1000.0000 mL | Freq: Once | INTRAVENOUS | Status: AC
  Administered 2018-09-17: 1000 mL via INTRAVENOUS

## 2018-09-17 MED ORDER — INSULIN ASPART 100 UNIT/ML ~~LOC~~ SOLN
SUBCUTANEOUS | Status: AC
  Filled 2018-09-17: qty 1

## 2018-09-17 NOTE — ED Notes (Signed)
Dr. Roxan Hockeyobinson notified of pt's blood sugar after fluids and insulin, ok for pt to discharge, pt provided snack with CHO prior to discharge. Denies any s/s of hypoglycemia at this time.

## 2018-09-17 NOTE — ED Notes (Signed)
  CBG 265  

## 2018-09-17 NOTE — ED Notes (Signed)
Patient reports blurred vision and nausea since last night. States he has noticed his blood sugar has been getting higher since last night and that he usually has similar symptoms with hyperglycemia. Patient states he takes levemir for diabetes but it hasn't been working to control his sugar for the last week or two.

## 2018-09-17 NOTE — ED Triage Notes (Signed)
Says he thinks his sugar is up. Says blurred vision and nausea.

## 2018-09-17 NOTE — ED Provider Notes (Signed)
[facility] Emergency Department Provider Note    First MD Initiated Contact with Patient 09/17/18 1424     (approximate)  I have reviewed the triage vital signs and the nursing notes.   HISTORY  Chief Complaint Hyperglycemia; Blurred Vision; and Nausea    HPI Keith Schmidt is a 42 y.o. male with a history of diabetes on insulin presents the ER with chief complaint of generalized fatigue some blurry vision and nausea.  States he gets this frequently when his blood sugar is elevated.  Denies any cough or congestion.  No recent fevers.  No changes to his medication or diet.  She does have a history of prostatitis and has noted some discomfort when using the restroom over the past day or so.  Not been on any recent antibiotics.  No recent steroids.    Past Medical History:  Diagnosis Date  . Diabetes mellitus without complication (HCC)   . Hyperlipidemia    Family History  Problem Relation Age of Onset  . Diabetes Mother   . Depression Maternal Aunt   . Depression Maternal Uncle   . Depression Maternal Grandmother   . Arthritis Maternal Grandmother   . Birth defects Maternal Grandmother        Pancreatic  . Heart disease Maternal Grandfather   . Depression Paternal Grandmother   . Arthritis Paternal Grandmother    History reviewed. No pertinent surgical history. Patient Active Problem List   Diagnosis Date Noted  . Diabetes mellitus type 2, uncontrolled, without complications (HCC) 05/19/2015  . Hyperlipidemia 05/19/2015      Prior to Admission medications   Medication Sig Start Date End Date Taking? Authorizing Provider  atorvastatin (LIPITOR) 20 MG tablet Take 1 tablet (20 mg total) by mouth at bedtime. 01/22/17   Particia Nearing, PA-C  glipiZIDE (GLUCOTROL) 10 MG tablet Take 1 tablet (10 mg total) by mouth 2 (two) times daily before a meal. 01/22/17   Particia Nearing, PA-C  hydrocortisone (ANUSOL-HC) 2.5 % rectal cream Place  1 application rectally 2 (two) times daily. 01/22/17   Particia Nearing, PA-C  meloxicam (MOBIC) 15 MG tablet Take 1 tablet (15 mg total) daily by mouth. 08/15/17   Triplett, Rulon Eisenmenger B, FNP  metFORMIN (GLUCOPHAGE) 1000 MG tablet Take 1 tablet (1,000 mg total) by mouth 2 (two) times daily with a meal. 01/22/17   Particia Nearing, PA-C  predniSONE (DELTASONE) 50 MG tablet Take one 50 mg tablet once daily for 5 days. 12/26/17   Orvil Feil, PA-C    Allergies Atorvastatin    Social History Social History   Tobacco Use  . Smoking status: Never Smoker  . Smokeless tobacco: Never Used  Substance Use Topics  . Alcohol use: No    Alcohol/week: 0.0 standard drinks  . Drug use: No    Review of Systems Patient denies headaches, rhinorrhea, blurry vision, numbness, shortness of breath, chest pain, edema, cough, abdominal pain, nausea, vomiting, diarrhea, dysuria, fevers, rashes or hallucinations unless otherwise stated above in HPI. ____________________________________________   PHYSICAL EXAM:  VITAL SIGNS: Vitals:   09/17/18 1007 09/17/18 1427  BP: (!) 141/80 128/90  Pulse: 61 62  Resp: 18 18  Temp: 98.6 F (37 C)   SpO2: 99% 100%    Constitutional: Alert and oriented.  Eyes: Conjunctivae are normal.  Head: Atraumatic. Nose: No congestion/rhinnorhea. Mouth/Throat: Mucous membranes are moist.   Neck: No stridor. Painless ROM.  Cardiovascular: Normal rate, regular rhythm. Grossly normal heart sounds.  Good peripheral circulation. Respiratory: Normal respiratory effort.  No retractions. Lungs CTAB. Gastrointestinal: Soft and nontender. No distention. No abdominal bruits. No CVA tenderness. Genitourinary: Large but nontender prostate on DRE Musculoskeletal: No lower extremity tenderness nor edema.  No joint effusions. Neurologic:  Normal speech and language. No gross focal neurologic deficits are appreciated. No facial droop Skin:  Skin is warm, dry and intact. No rash  noted. Psychiatric: Mood and affect are normal. Speech and behavior are normal.  ____________________________________________   LABS (all labs ordered are listed, but only abnormal results are displayed)  Results for orders placed or performed during the hospital encounter of 09/17/18 (from the past 24 hour(s))  Glucose, capillary     Status: Abnormal   Collection Time: 09/17/18 10:10 AM  Result Value Ref Range   Glucose-Capillary 265 (H) 70 - 99 mg/dL  Basic metabolic panel     Status: Abnormal   Collection Time: 09/17/18 10:11 AM  Result Value Ref Range   Sodium 136 135 - 145 mmol/L   Potassium 4.2 3.5 - 5.1 mmol/L   Chloride 106 98 - 111 mmol/L   CO2 24 22 - 32 mmol/L   Glucose, Bld 290 (H) 70 - 99 mg/dL   BUN 15 6 - 20 mg/dL   Creatinine, Ser 1.61 0.61 - 1.24 mg/dL   Calcium 8.7 (L) 8.9 - 10.3 mg/dL   GFR calc non Af Amer >60 >60 mL/min   GFR calc Af Amer >60 >60 mL/min   Anion gap 6 5 - 15  CBC     Status: None   Collection Time: 09/17/18 10:11 AM  Result Value Ref Range   WBC 7.4 4.0 - 10.5 K/uL   RBC 5.13 4.22 - 5.81 MIL/uL   Hemoglobin 15.6 13.0 - 17.0 g/dL   HCT 09.6 04.5 - 40.9 %   MCV 90.1 80.0 - 100.0 fL   MCH 30.4 26.0 - 34.0 pg   MCHC 33.8 30.0 - 36.0 g/dL   RDW 81.1 91.4 - 78.2 %   Platelets 261 150 - 400 K/uL   nRBC 0.0 0.0 - 0.2 %  Urinalysis, Complete w Microscopic     Status: Abnormal   Collection Time: 09/17/18 10:16 AM  Result Value Ref Range   Color, Urine YELLOW (A) YELLOW   APPearance CLEAR (A) CLEAR   Specific Gravity, Urine 1.022 1.005 - 1.030   pH 5.0 5.0 - 8.0   Glucose, UA >=500 (A) NEGATIVE mg/dL   Hgb urine dipstick NEGATIVE NEGATIVE   Bilirubin Urine NEGATIVE NEGATIVE   Ketones, ur NEGATIVE NEGATIVE mg/dL   Protein, ur NEGATIVE NEGATIVE mg/dL   Nitrite NEGATIVE NEGATIVE   Leukocytes, UA NEGATIVE NEGATIVE   WBC, UA 0-5 0 - 5 WBC/hpf   Bacteria, UA NONE SEEN NONE SEEN   Squamous Epithelial / LPF NONE SEEN 0 - 5   Mucus PRESENT     ____________________________________________ _________________________________  RADIOLOGY   ____________________________________________   PROCEDURES  Procedure(s) performed:  Procedures    Critical Care performed: no ____________________________________________   INITIAL IMPRESSION / ASSESSMENT AND PLAN / ED COURSE  Pertinent labs & imaging results that were available during my care of the patient were reviewed by me and considered in my medical decision making (see chart for details).   DDX: dehydration, hyperglycemia, DKA, HHS, UTI, prostatitis, medication noncompliance  Thierry Dobosz is a 42 y.o. who presents to the ED with hyperglycemia symptoms as described above.  Patient is well-appearing no acute distress.  Abdominal exam soft  and benign.  No evidence of prostatitis.  No evidence of acute infectious process.  Mildly dehydrated with improvement in symptoms after receiving IV fluids as well as insulin.  Discussed options and changes to his insulin regimen at home.  At this point do believe he stable and appropriate for outpatient follow-up      As part of my medical decision making, I reviewed the following data within the electronic MEDICAL RECORD NUMBER Nursing notes reviewed and incorporated, Labs reviewed, notes from prior ED visits.   ____________________________________________   FINAL CLINICAL IMPRESSION(S) / ED DIAGNOSES  Final diagnoses:  Hyperglycemia      NEW MEDICATIONS STARTED DURING THIS VISIT:  New Prescriptions   No medications on file     Note:  This document was prepared using Dragon voice recognition software and may include unintentional dictation errors.    Willy Eddyobinson, Shirlyn Savin, MD 09/17/18 1520

## 2018-09-17 NOTE — Discharge Instructions (Addendum)
To help manage her glucose you can try taking 20 units of your insulin in the evening and 10 in the morning but be sure to keep a close eye on your blood sugars with meals throughout the day.  Return to ER if you have any additional symptoms any worsening symptoms or for any additional questions or concerns.

## 2018-12-12 ENCOUNTER — Emergency Department
Admission: EM | Admit: 2018-12-12 | Discharge: 2018-12-12 | Disposition: A | Discharge status: Home / Self Care | Payer: Self-pay | Attending: Emergency Medicine | Admitting: Emergency Medicine

## 2018-12-12 ENCOUNTER — Emergency Department: Payer: Self-pay

## 2018-12-12 ENCOUNTER — Encounter: Payer: Self-pay | Admitting: Emergency Medicine

## 2018-12-12 ENCOUNTER — Other Ambulatory Visit: Payer: Self-pay

## 2018-12-12 DIAGNOSIS — R1033 Periumbilical pain: Secondary | ICD-10-CM | POA: Insufficient documentation

## 2018-12-12 DIAGNOSIS — Z7984 Long term (current) use of oral hypoglycemic drugs: Secondary | ICD-10-CM | POA: Insufficient documentation

## 2018-12-12 DIAGNOSIS — E119 Type 2 diabetes mellitus without complications: Secondary | ICD-10-CM | POA: Insufficient documentation

## 2018-12-12 DIAGNOSIS — Z79899 Other long term (current) drug therapy: Secondary | ICD-10-CM | POA: Insufficient documentation

## 2018-12-12 LAB — CBC
HCT: 46.7 % (ref 39.0–52.0)
Hemoglobin: 15.9 g/dL (ref 13.0–17.0)
MCH: 30.3 pg (ref 26.0–34.0)
MCHC: 34 g/dL (ref 30.0–36.0)
MCV: 89 fL (ref 80.0–100.0)
Platelets: 294 10*3/uL (ref 150–400)
RBC: 5.25 MIL/uL (ref 4.22–5.81)
RDW: 11.9 % (ref 11.5–15.5)
WBC: 8.8 10*3/uL (ref 4.0–10.5)
nRBC: 0 % (ref 0.0–0.2)

## 2018-12-12 LAB — COMPREHENSIVE METABOLIC PANEL
ALT: 44 U/L (ref 0–44)
AST: 32 U/L (ref 15–41)
Albumin: 4.6 g/dL (ref 3.5–5.0)
Alkaline Phosphatase: 64 U/L (ref 38–126)
Anion gap: 8 (ref 5–15)
BUN: 16 mg/dL (ref 6–20)
CHLORIDE: 100 mmol/L (ref 98–111)
CO2: 26 mmol/L (ref 22–32)
Calcium: 9 mg/dL (ref 8.9–10.3)
Creatinine, Ser: 0.93 mg/dL (ref 0.61–1.24)
GFR calc Af Amer: >60 mL/min (ref >60)
GFR calc non Af Amer: >60 mL/min (ref >60)
Glucose, Bld: 313 mg/dL — ABNORMAL HIGH (ref 70–99)
Potassium: 4.2 mmol/L (ref 3.5–5.1)
SODIUM: 134 mmol/L — AB (ref 135–145)
Total Bilirubin: 1.1 mg/dL (ref 0.3–1.2)
Total Protein: 8.2 g/dL — ABNORMAL HIGH (ref 6.5–8.1)

## 2018-12-12 LAB — LIPASE, BLOOD: Lipase: 27 U/L (ref 11–51)

## 2018-12-12 MED ORDER — SUCRALFATE 1 G PO TABS: 1.0000 g | ORAL_TABLET | Freq: Four times a day (QID) | ORAL | 1 refills | Status: DC

## 2018-12-12 MED ORDER — IOPAMIDOL (ISOVUE-300) INJECTION 61%
30.0000 mL | Freq: Once | INTRAVENOUS | Status: AC | PRN
  Administered 2018-12-12: 30 mL via ORAL

## 2018-12-12 MED ORDER — PANTOPRAZOLE SODIUM 20 MG PO TBEC: 20.0000 mg | DELAYED_RELEASE_TABLET | Freq: Every day | ORAL | 1 refills | Status: AC

## 2018-12-12 MED ORDER — IOHEXOL 300 MG/ML  SOLN
100.0000 mL | Freq: Once | INTRAMUSCULAR | Status: AC | PRN
  Administered 2018-12-12: 100 mL via INTRAVENOUS

## 2018-12-12 MED ORDER — SODIUM CHLORIDE 0.9% FLUSH: 3.0000 mL | Freq: Once | INTRAVENOUS | Status: DC

## 2018-12-12 NOTE — ED Provider Notes (Signed)
[facility] Emergency Department Provider Note   ____________________________________________    I have reviewed the triage vital signs and the nursing notes.   HISTORY  Chief Complaint Abdominal Pain     HPI Keith Schmidt is a 43 y.o. male who presents with complaints of abdominal pain.  Patient reports a history of diabetes and elevated cholesterol, no history of abdominal surgeries.  He reports over the last week he has had intermittent pains in a bandlike distribution in the middle of his abdomen which he describes as severe hunger pains even though he may have recently eaten.  Denies fevers or chills.  Normal stools.  No nausea or vomiting.  No recent travel.  Has not take anything for this.  Was recently on antibiotics but denies diarrhea.   Past Medical History:  Diagnosis Date  . Diabetes mellitus without complication (HCC)   . Hyperlipidemia     Patient Active Problem List   Diagnosis Date Noted  . Diabetes mellitus type 2, uncontrolled, without complications (HCC) 05/19/2015  . Hyperlipidemia 05/19/2015    History reviewed. No pertinent surgical history.  Prior to Admission medications   Medication Sig Start Date End Date Taking? Authorizing Provider  atorvastatin (LIPITOR) 20 MG tablet Take 1 tablet (20 mg total) by mouth at bedtime. 01/22/17   Particia Nearing, PA-C  glipiZIDE (GLUCOTROL) 10 MG tablet Take 1 tablet (10 mg total) by mouth 2 (two) times daily before a meal. 01/22/17   Particia Nearing, PA-C  hydrocortisone (ANUSOL-HC) 2.5 % rectal cream Place 1 application rectally 2 (two) times daily. 01/22/17   Particia Nearing, PA-C  meloxicam (MOBIC) 15 MG tablet Take 1 tablet (15 mg total) daily by mouth. 08/15/17   Triplett, Rulon Eisenmenger B, FNP  metFORMIN (GLUCOPHAGE) 1000 MG tablet Take 1 tablet (1,000 mg total) by mouth 2 (two) times daily with a meal. 01/22/17   Particia Nearing, PA-C  pantoprazole (PROTONIX) 20  MG tablet Take 1 tablet (20 mg total) by mouth daily. 12/12/18 12/12/19  Jene Every, MD  predniSONE (DELTASONE) 50 MG tablet Take one 50 mg tablet once daily for 5 days. 12/26/17   Orvil Feil, PA-C  sucralfate (CARAFATE) 1 g tablet Take 1 tablet (1 g total) by mouth 4 (four) times daily for 15 days. 12/12/18 12/27/18  Jene Every, MD     Allergies Atorvastatin  Family History  Problem Relation Age of Onset  . Diabetes Mother   . Depression Maternal Aunt   . Depression Maternal Uncle   . Depression Maternal Grandmother   . Arthritis Maternal Grandmother   . Birth defects Maternal Grandmother        Pancreatic  . Heart disease Maternal Grandfather   . Depression Paternal Grandmother   . Arthritis Paternal Grandmother     Social History Social History   Tobacco Use  . Smoking status: Never Smoker  . Smokeless tobacco: Never Used  Substance Use Topics  . Alcohol use: No    Alcohol/week: 0.0 standard drinks  . Drug use: No    Review of Systems  Constitutional: No fever/chills Eyes: No visual changes.  ENT: No sore throat. Cardiovascular: Denies chest pain. Respiratory: Denies shortness of breath. Gastrointestinal: As above Genitourinary: Negative for dysuria. Musculoskeletal: Negative for back pain. Skin: Negative for rash. Neurological: Negative for headaches   ____________________________________________   PHYSICAL EXAM:  VITAL SIGNS: ED Triage Vitals [12/12/18 1153]  Enc Vitals Group     BP 116/82  Pulse Rate 77     Resp 16     Temp 98.2 F (36.8 C)     Temp Source Oral     SpO2 98 %     Weight 78.9 kg (174 lb)     Height 1.727 m (5\' 8" )     Head Circumference      Peak Flow      Pain Score 8     Pain Loc      Pain Edu?      Excl. in GC?     Constitutional: Alert and oriented. Eyes: Conjunctivae are normal.   Nose: No congestion/rhinnorhea. Mouth/Throat: Mucous membranes are moist.    Cardiovascular: Normal rate, regular rhythm.  Grossly normal heart sounds.  Good peripheral circulation. Respiratory: Normal respiratory effort.  No retractions. Lungs CTAB. Gastrointestinal: Soft and nontender. No distention.  No CVA tenderness.  Musculoskeletal: No lower extremity tenderness nor edema.  Warm and well perfused Neurologic:  Normal speech and language. No gross focal neurologic deficits are appreciated.  Skin:  Skin is warm, dry and intact. No rash noted. Psychiatric: Mood and affect are normal. Speech and behavior are normal.  ____________________________________________   LABS (all labs ordered are listed, but only abnormal results are displayed)  Labs Reviewed  COMPREHENSIVE METABOLIC PANEL - Abnormal; Notable for the following components:      Result Value   Sodium 134 (*)    Glucose, Bld 313 (*)    Total Protein 8.2 (*)    All other components within normal limits  LIPASE, BLOOD  CBC  URINALYSIS, COMPLETE (UACMP) WITH MICROSCOPIC   ____________________________________________  EKG  None ____________________________________________  RADIOLOGY   ____________________________________________   PROCEDURES  Procedure(s) performed: No  Procedures   Critical Care performed: No ____________________________________________   INITIAL IMPRESSION / ASSESSMENT AND PLAN / ED COURSE  Pertinent labs & imaging results that were available during my care of the patient were reviewed by me and considered in my medical decision making (see chart for details).  Patient well-appearing in no acute distress.  Abdominal exam is quite reassuring.  Does not smoke, no drugs, no alcohol.  Symptoms are suspicious for gastritis/PUD given intermittent nature.  Will obtain labs and consider imaging.  CT abdomen pelvis is reassuring, we will start the patient on PPI and Carafate with follow-up with GI    ____________________________________________   FINAL CLINICAL IMPRESSION(S) / ED DIAGNOSES  Final diagnoses:    Periumbilical abdominal pain        Note:  This document was prepared using Dragon voice recognition software and may include unintentional dictation errors.   Jene Every, MD 12/12/18 [phone]

## 2018-12-12 NOTE — ED Notes (Signed)
Patient transported to CT 

## 2018-12-12 NOTE — ED Triage Notes (Signed)
PT c/o abd pain x1wk, was seen at [facility] and told to follow up if pain did not resolve. PT states " it feels like I'm hungry and then random sharp pains" . PT denies pain being localized. NAD noted. Denies n/v/d

## 2019-08-27 ENCOUNTER — Other Ambulatory Visit: Payer: Self-pay

## 2019-08-27 ENCOUNTER — Ambulatory Visit: Payer: Self-pay

## 2019-08-27 DIAGNOSIS — Z021 Encounter for pre-employment examination: Secondary | ICD-10-CM

## 2019-08-27 NOTE — Progress Notes (Signed)
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  Specimen collected using [employer] Chain of Custody form on [employer] account number [id number]. Specimen # [id number]  AMD

## 2019-12-10 DIAGNOSIS — Z20828 Contact with and (suspected) exposure to other viral communicable diseases: Secondary | ICD-10-CM | POA: Diagnosis not present

## 2019-12-31 DIAGNOSIS — Z0000 Encounter for general adult medical examination without abnormal findings: Secondary | ICD-10-CM | POA: Insufficient documentation

## 2019-12-31 NOTE — Progress Notes (Signed)
Scheduled to complete physical 01/06/2020 with Durward Parcel, PA-C.  States DM Dx'd about 5 years ago. States only takes Metformin, but in the past he took Levemir & Glypiside.  AMD

## 2020-01-02 ENCOUNTER — Ambulatory Visit: Payer: Self-pay

## 2020-01-02 ENCOUNTER — Other Ambulatory Visit: Payer: Self-pay

## 2020-01-02 DIAGNOSIS — Z0000 Encounter for general adult medical examination without abnormal findings: Secondary | ICD-10-CM

## 2020-01-02 LAB — POCT URINALYSIS DIPSTICK
Bilirubin, UA: NEGATIVE
Blood, UA: NEGATIVE
Glucose, UA: POSITIVE — AB
Leukocytes, UA: NEGATIVE
Nitrite, UA: NEGATIVE
Protein, UA: NEGATIVE
Spec Grav, UA: 1.02 (ref 1.010–1.025)
Urobilinogen, UA: 0.2 E.U./dL
pH, UA: 5.5 (ref 5.0–8.0)

## 2020-01-05 LAB — CMP12+LP+TP+TSH+6AC+PSA+CBC?
BUN: 14 mg/dL (ref 6–24)
Cholesterol, Total: 264 mg/dL — ABNORMAL HIGH (ref 100–199)
EOS (ABSOLUTE): 0.1 10*3/uL (ref 0.0–0.4)
Free Thyroxine Index: 2.6 (ref 1.2–4.9)
GFR calc Af Amer: 105 mL/min/{1.73_m2} (ref >59)
RDW: 11.8 % (ref 11.6–15.4)
TSH: 1.1 u[IU]/mL (ref 0.450–4.500)

## 2020-01-05 LAB — CMP12+LP+TP+TSH+6AC+PSA+CBC…
ALT: 48 IU/L — ABNORMAL HIGH (ref 0–44)
AST: 42 IU/L — ABNORMAL HIGH (ref 0–40)
Albumin/Globulin Ratio: 1.8 (ref 1.2–2.2)
Albumin: 5.2 g/dL — ABNORMAL HIGH (ref 4.0–5.0)
Alkaline Phosphatase: 98 IU/L (ref 39–117)
BUN/Creatinine Ratio: 14 (ref 9–20)
Basophils Absolute: 0.1 10*3/uL (ref 0.0–0.2)
Basos: 1 %
Bilirubin Total: 0.6 mg/dL (ref 0.0–1.2)
Calcium: 9.9 mg/dL (ref 8.7–10.2)
Chloride: 97 mmol/L (ref 96–106)
Chol/HDL Ratio: 7.3 ratio — ABNORMAL HIGH (ref 0.0–5.0)
Creatinine, Ser: 1 mg/dL (ref 0.76–1.27)
Eos: 1 %
Estimated CHD Risk: 1.5 times avg. — ABNORMAL HIGH (ref 0.0–1.0)
GFR calc non Af Amer: 90 mL/min/{1.73_m2} (ref >59)
GGT: 34 IU/L (ref 0–65)
Globulin, Total: 2.9 g/dL (ref 1.5–4.5)
Glucose: 379 mg/dL — ABNORMAL HIGH (ref 65–99)
HDL: 36 mg/dL — ABNORMAL LOW (ref >39)
Hematocrit: 49.7 % (ref 37.5–51.0)
Hemoglobin: 17.2 g/dL (ref 13.0–17.7)
Immature Grans (Abs): 0 10*3/uL (ref 0.0–0.1)
Immature Granulocytes: 0 %
Iron: 130 ug/dL (ref 38–169)
LDH: 204 IU/L (ref 121–224)
LDL Chol Calc (NIH): 202 mg/dL — ABNORMAL HIGH (ref 0–99)
Lymphocytes Absolute: 2 10*3/uL (ref 0.7–3.1)
Lymphs: 30 %
MCH: 30.8 pg (ref 26.6–33.0)
MCHC: 34.6 g/dL (ref 31.5–35.7)
MCV: 89 fL (ref 79–97)
Monocytes Absolute: 0.5 10*3/uL (ref 0.1–0.9)
Monocytes: 7 %
Neutrophils Absolute: 4.2 10*3/uL (ref 1.4–7.0)
Neutrophils: 61 %
Phosphorus: 3.2 mg/dL (ref 2.8–4.1)
Platelets: 245 10*3/uL (ref 150–450)
Potassium: 5.2 mmol/L (ref 3.5–5.2)
Prostate Specific Ag, Serum: 1.8 ng/mL (ref 0.0–4.0)
RBC: 5.59 x10E6/uL (ref 4.14–5.80)
Sodium: 136 mmol/L (ref 134–144)
T3 Uptake Ratio: 34 % (ref 24–39)
T4, Total: 7.5 ug/dL (ref 4.5–12.0)
Total Protein: 8.1 g/dL (ref 6.0–8.5)
Triglycerides: 139 mg/dL (ref 0–149)
Uric Acid: 3.5 mg/dL — ABNORMAL LOW (ref 3.8–8.4)
VLDL Cholesterol Cal: 26 mg/dL (ref 5–40)
WBC: 6.9 10*3/uL (ref 3.4–10.8)

## 2020-01-05 LAB — HGB A1C W/O EAG: Hgb A1c MFr Bld: >15.5 % — ABNORMAL HIGH (ref 4.8–5.6)

## 2020-01-05 LAB — MICROALBUMIN / CREATININE URINE RATIO
Creatinine, Urine: 74.2 mg/dL
Microalb/Creat Ratio: 33 mg/g creat — ABNORMAL HIGH (ref 0–29)
Microalbumin, Urine: 24.7 ug/mL

## 2020-01-06 ENCOUNTER — Other Ambulatory Visit: Payer: Self-pay

## 2020-01-06 ENCOUNTER — Ambulatory Visit: Payer: Self-pay | Admitting: Physician Assistant

## 2020-01-06 ENCOUNTER — Encounter: Payer: Self-pay | Admitting: Physician Assistant

## 2020-01-06 VITALS — BP 118/84 | HR 98 | Temp 98.8°F | Resp 16 | Ht 68.0 in | Wt 165.0 lb

## 2020-01-06 DIAGNOSIS — Z0000 Encounter for general adult medical examination without abnormal findings: Secondary | ICD-10-CM

## 2020-01-06 NOTE — Progress Notes (Addendum)
Still sees Dr Marcello Fennel at [facility] & has an appt with him next week 01/15/20.  AMD  Electronically forwarded patient's labs to Dr. Marcello Fennel.  AMD

## 2020-01-06 NOTE — Progress Notes (Signed)
   Subjective:Annual physical    Patient ID: Keith Schmidt, male    DOB: [birth year], 44 y.o.   MRN: [id number]  HPI Patient presents for annual physical.  Patient admits to noncompliance of diabetic and hyperlipidemic medicines. Review of Systems Diabetes and hypertension    Objective:   Physical Exam Patient appears no acute distress.  HEENT is grossly unremarkable.  Neck is supple without adenopathy or bruits.  Lungs are clear to auscultation heart regular rate and rhythm.  Abdomen is negative HSM, normoactive bowel sounds, and soft nontender palpation.  No obvious deformity to the upper or lower extremities.  Patient full equal range of motion upper lower extremities.  No obvious deformity to cervical lumbar spine.  Patient full equal range of motion cervical lumbar spine.  Patient skin is very dry and flaky.  Cranial nerves II through XII grossly intact.       Assessment & Plan: Well exam.  Discussed patient increase hemoglobin A1c elevated cholesterol.  Patient has history of noncompliance with medication.  Patient has a schedule appointment with his PCP of internal medicine next week to discuss modalities to treat diabetes and hyperlipidemia.

## 2020-01-15 DIAGNOSIS — N529 Male erectile dysfunction, unspecified: Secondary | ICD-10-CM | POA: Diagnosis not present

## 2020-01-15 DIAGNOSIS — E1165 Type 2 diabetes mellitus with hyperglycemia: Secondary | ICD-10-CM | POA: Diagnosis not present

## 2020-01-15 DIAGNOSIS — Z794 Long term (current) use of insulin: Secondary | ICD-10-CM | POA: Diagnosis not present

## 2020-01-15 DIAGNOSIS — Z7689 Persons encountering health services in other specified circumstances: Secondary | ICD-10-CM | POA: Diagnosis not present

## 2020-03-05 DIAGNOSIS — E1165 Type 2 diabetes mellitus with hyperglycemia: Secondary | ICD-10-CM | POA: Diagnosis not present

## 2020-03-05 DIAGNOSIS — Z794 Long term (current) use of insulin: Secondary | ICD-10-CM | POA: Diagnosis not present

## 2020-03-05 DIAGNOSIS — E785 Hyperlipidemia, unspecified: Secondary | ICD-10-CM | POA: Diagnosis not present

## 2020-03-05 DIAGNOSIS — N529 Male erectile dysfunction, unspecified: Secondary | ICD-10-CM | POA: Diagnosis not present

## 2020-08-09 ENCOUNTER — Encounter: Payer: Self-pay | Admitting: Intensive Care

## 2020-08-09 ENCOUNTER — Emergency Department
Admission: EM | Admit: 2020-08-09 | Discharge: 2020-08-09 | Disposition: A | Discharge status: Home / Self Care | Payer: BC Managed Care – PPO | Attending: Student in an Organized Health Care Education/Training Program | Admitting: Student in an Organized Health Care Education/Training Program

## 2020-08-09 ENCOUNTER — Other Ambulatory Visit: Payer: Self-pay

## 2020-08-09 ENCOUNTER — Emergency Department: Payer: BC Managed Care – PPO

## 2020-08-09 DIAGNOSIS — E1122 Type 2 diabetes mellitus with diabetic chronic kidney disease: Secondary | ICD-10-CM | POA: Diagnosis not present

## 2020-08-09 DIAGNOSIS — N181 Chronic kidney disease, stage 1: Secondary | ICD-10-CM | POA: Diagnosis not present

## 2020-08-09 DIAGNOSIS — Z794 Long term (current) use of insulin: Secondary | ICD-10-CM | POA: Insufficient documentation

## 2020-08-09 DIAGNOSIS — R0602 Shortness of breath: Secondary | ICD-10-CM | POA: Insufficient documentation

## 2020-08-09 DIAGNOSIS — R079 Chest pain, unspecified: Secondary | ICD-10-CM | POA: Diagnosis present

## 2020-08-09 DIAGNOSIS — R0789 Other chest pain: Secondary | ICD-10-CM | POA: Diagnosis not present

## 2020-08-09 DIAGNOSIS — Z7984 Long term (current) use of oral hypoglycemic drugs: Secondary | ICD-10-CM | POA: Diagnosis not present

## 2020-08-09 LAB — TROPONIN I (HIGH SENSITIVITY)
Troponin I (High Sensitivity): 4 ng/L (ref <18)
Troponin I (High Sensitivity): 4 ng/L (ref <18)

## 2020-08-09 LAB — BASIC METABOLIC PANEL
Anion gap: 9 (ref 5–15)
BUN: 20 mg/dL (ref 6–20)
CO2: 25 mmol/L (ref 22–32)
Calcium: 9.2 mg/dL (ref 8.9–10.3)
Chloride: 104 mmol/L (ref 98–111)
Creatinine, Ser: 0.93 mg/dL (ref 0.61–1.24)
GFR, Estimated: >60 mL/min (ref >60)
Glucose, Bld: 179 mg/dL — ABNORMAL HIGH (ref 70–99)
Potassium: 4.8 mmol/L (ref 3.5–5.1)
Sodium: 138 mmol/L (ref 135–145)

## 2020-08-09 LAB — CBC
HCT: 44.1 % (ref 39.0–52.0)
Hemoglobin: 15.3 g/dL (ref 13.0–17.0)
MCH: 30.2 pg (ref 26.0–34.0)
MCHC: 34.7 g/dL (ref 30.0–36.0)
MCV: 87.2 fL (ref 80.0–100.0)
Platelets: 279 10*3/uL (ref 150–400)
RBC: 5.06 MIL/uL (ref 4.22–5.81)
RDW: 12.5 % (ref 11.5–15.5)
WBC: 8.1 10*3/uL (ref 4.0–10.5)
nRBC: 0 % (ref 0.0–0.2)

## 2020-08-09 NOTE — ED Notes (Signed)
Pt assessed by provider prior to d/c.  

## 2020-08-09 NOTE — ED Triage Notes (Signed)
C/o new left chest tightness X1 month with radiation to shoulders. Comes and goes.

## 2020-08-09 NOTE — ED Provider Notes (Signed)
[facility] Emergency Department Provider Note    First MD Initiated Contact with Patient 08/09/20 1040     (approximate)  I have reviewed the triage vital signs and the nursing notes.   HISTORY  Chief Complaint Chest Pain    HPI Candice Lunney is a 44 y.o. male with a history of insulin-dependent diabetes presents to the ER for 1 month of daily left-sided chest pain twisting in nature associated with some shortness of breath. States it happens sporadically lasts only a few moments. Denies any palpitations. No nausea. No diaphoresis. No recent fevers. Denies any recent trauma. He is currently pain-free. Came in today because his significant other encouraged him.  Does not smoke    Past Medical History:  Diagnosis Date  . Diabetes mellitus without complication (HCC)   . Hyperlipidemia    Family History  Problem Relation Age of Onset  . Diabetes Mother   . Depression Maternal Aunt   . Depression Maternal Uncle   . Depression Maternal Grandmother   . Arthritis Maternal Grandmother   . Birth defects Maternal Grandmother        Pancreatic  . Heart disease Maternal Grandfather   . Depression Paternal Grandmother   . Arthritis Paternal Grandmother    History reviewed. No pertinent surgical history. Patient Active Problem List   Diagnosis Date Noted  . Encounter for general adult medical examination without abnormal findings 12/31/2019  . HLD (hyperlipidemia) 05/19/2015  . 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 Authorizing Provider  glipiZIDE (GLUCOTROL) 10 MG tablet Take 1 tablet (10 mg total) by mouth 2 (two) times daily before a meal. Patient not taking: Reported on 01/06/2020 01/22/17   Particia Nearing, PA-C  insulin detemir (LEVEMIR) 100 UNIT/ML injection Inject into the skin.    [provider]  metFORMIN (GLUCOPHAGE) 1000 MG tablet Take 1 tablet (1,000 mg total) by mouth 2 (two) times daily with a meal. 01/22/17   Particia Nearing, PA-C  multivitamin (ONE-A-DAY MEN'S) TABS tablet Take by mouth.    [provider]  pantoprazole (PROTONIX) 20 MG tablet Take 1 tablet (20 mg total) by mouth daily. 12/12/18 12/12/19  Jene Every, MD    Allergies Atorvastatin    Social History Social History   Tobacco Use  . Smoking status: Never Smoker  . Smokeless tobacco: Never Used  Substance Use Topics  . Alcohol use: No    Alcohol/week: 0.0 standard drinks  . Drug use: No    Review of Systems Patient denies headaches, rhinorrhea, blurry vision, numbness, shortness of breath, chest pain, edema, cough, abdominal pain, nausea, vomiting, diarrhea, dysuria, fevers, rashes or hallucinations unless otherwise stated above in HPI. ____________________________________________   PHYSICAL EXAM:  VITAL SIGNS: Vitals:   08/09/20 0754 08/09/20 1048  BP: (!) 122/91 (!) 116/97  Pulse: 77 72  Resp: 16 20  Temp: 97.9 F (36.6 C)   SpO2: 98% 100%    Constitutional: Alert and oriented.  Eyes: Conjunctivae are normal.  Head: Atraumatic. Nose: No congestion/rhinnorhea. Mouth/Throat: Mucous membranes are moist.   Neck: No stridor. Painless ROM.  Cardiovascular: Normal rate, regular rhythm. Grossly normal heart sounds.  Good peripheral circulation. Respiratory: Normal respiratory effort.  No retractions. Lungs CTAB. Gastrointestinal: Soft and nontender. No distention. No abdominal bruits. No CVA tenderness. Genitourinary:  Musculoskeletal:  No lower extremity tenderness nor edema.  No joint effusions. Neurologic:  Normal speech and language. No gross focal neurologic deficits are appreciated. No facial droop Skin:  Skin is warm, dry and intact. No rash noted. Psychiatric: Mood and affect are normal. Speech and behavior are  normal.  ____________________________________________   LABS (all labs ordered are listed, but only abnormal results are displayed)  Results for orders placed or performed during the hospital encounter of 08/09/20 (from the past 24 hour(s))  Basic metabolic panel     Status: Abnormal   Collection Time: 08/09/20  7:58 AM  Result Value Ref Range   Sodium 138 135 - 145 mmol/L   Potassium 4.8 3.5 - 5.1 mmol/L   Chloride 104 98 - 111 mmol/L   CO2 25 22 - 32 mmol/L   Glucose, Bld 179 (H) 70 - 99 mg/dL   BUN 20 6 - 20 mg/dL   Creatinine, Ser 7.00 0.61 - 1.24 mg/dL   Calcium 9.2 8.9 - 17.4 mg/dL   GFR, Estimated >94 >49 mL/min   Anion gap 9 5 - 15  CBC     Status: None   Collection Time: 08/09/20  7:58 AM  Result Value Ref Range   WBC 8.1 4.0 - 10.5 K/uL   RBC 5.06 4.22 - 5.81 MIL/uL   Hemoglobin 15.3 13.0 - 17.0 g/dL   HCT 67.5 39 - 52 %   MCV 87.2 80.0 - 100.0 fL   MCH 30.2 26.0 - 34.0 pg   MCHC 34.7 30.0 - 36.0 g/dL   RDW 91.6 38.4 - 66.5 %   Platelets 279 150 - 400 K/uL   nRBC 0.0 0.0 - 0.2 %  Troponin I (High Sensitivity)     Status: None   Collection Time: 08/09/20  7:58 AM  Result Value Ref Range   Troponin I (High Sensitivity) 4 <18 ng/L   ____________________________________________  EKG My review and personal interpretation at Time: 7:30   Indication: chest pain  Rate: 75  Rhythm: sinus Axis: normal Other: normal intervals, no stemi, nonspecific st abn ____________________________________________  RADIOLOGY  I personally reviewed all radiographic images ordered to evaluate for the above acute complaints and reviewed radiology reports and findings.  These findings were personally discussed with the patient.  Please see medical record for radiology report.  ____________________________________________   PROCEDURES  Procedure(s) performed:  Procedures    Critical Care performed: no ____________________________________________   INITIAL IMPRESSION /  ASSESSMENT AND PLAN / ED COURSE  Pertinent labs & imaging results that were available during my care of the patient were reviewed by me and considered in my medical decision making (see chart for details).   DDX: ACS, pericarditis, esophagitis, boerhaaves, pe, dissection, pna, bronchitis, costochondritis   Maxton Noreen is a 44 y.o. who presents to the ED with symptoms of chest discomfort shortness of breath for the past month as described above. He is currently afebrile hemodynamically stable. Not hypotensive. He is low risk by heart score. EKG nonischemic, troponin negative x 2. He is low risk by Wells criteria and is PERC negative. Not consistent with PE, pneumothorax, dissection or infectious process. Patient does appear clinically appropriate for close outpatient follow-up and referral to cardiology.  Have discussed with the patient and available family all diagnostics and treatments performed thus far and all questions were answered to the best of my ability. The patient demonstrates understanding and agreement with plan.     The patient was evaluated in Emergency Department today for the  symptoms described in the history of present illness. He/she was evaluated in the context of the global COVID-19 pandemic, which necessitated consideration that the patient might be at risk for infection with the SARS-CoV-2 virus that causes COVID-19. Institutional protocols and algorithms that pertain to the evaluation of patients at risk for COVID-19 are in a state of rapid change based on information released by regulatory bodies including the CDC and federal and state organizations. These policies and algorithms were followed during the patient's care in the ED.  As part of my medical decision making, I reviewed the following data within the electronic MEDICAL RECORD NUMBER Nursing notes reviewed and incorporated, Labs reviewed, notes from prior ED visits and [place] Controlled Substance  Database   ____________________________________________   FINAL CLINICAL IMPRESSION(S) / ED DIAGNOSES  Final diagnoses:  Atypical chest pain      NEW MEDICATIONS STARTED DURING THIS VISIT:  New Prescriptions   No medications on file     Note:  This document was prepared using Dragon voice recognition software and may include unintentional dictation errors.    Willy Eddy, MD 08/09/20 1059

## 2020-08-09 NOTE — ED Notes (Signed)
Pt presents to the ED for L sided chest tightness that has been recurring over the last month. Pt states that it is accompanied by mild SOB. Denies NVD. Pt states that pain sometimes radiates to shoulders. Hx of diabetes. No cardiac hx. Pt is A&Ox4 and NAD. Ambulatory to room. Pt rates pain 4/10.

## 2021-02-25 ENCOUNTER — Emergency Department
Admission: EM | Admit: 2021-02-25 | Discharge: 2021-02-25 | Disposition: A | Discharge status: Home / Self Care | Payer: BC Managed Care – PPO | Attending: Emergency Medicine | Admitting: Emergency Medicine

## 2021-02-25 ENCOUNTER — Encounter: Payer: Self-pay | Admitting: Emergency Medicine

## 2021-02-25 ENCOUNTER — Other Ambulatory Visit: Payer: Self-pay

## 2021-02-25 ENCOUNTER — Emergency Department: Payer: BC Managed Care – PPO

## 2021-02-25 DIAGNOSIS — Z7984 Long term (current) use of oral hypoglycemic drugs: Secondary | ICD-10-CM | POA: Diagnosis not present

## 2021-02-25 DIAGNOSIS — R519 Headache, unspecified: Secondary | ICD-10-CM | POA: Diagnosis not present

## 2021-02-25 DIAGNOSIS — R531 Weakness: Secondary | ICD-10-CM | POA: Diagnosis not present

## 2021-02-25 DIAGNOSIS — G8929 Other chronic pain: Secondary | ICD-10-CM | POA: Insufficient documentation

## 2021-02-25 DIAGNOSIS — Z794 Long term (current) use of insulin: Secondary | ICD-10-CM | POA: Insufficient documentation

## 2021-02-25 DIAGNOSIS — E1122 Type 2 diabetes mellitus with diabetic chronic kidney disease: Secondary | ICD-10-CM | POA: Diagnosis not present

## 2021-02-25 DIAGNOSIS — Z20822 Contact with and (suspected) exposure to covid-19: Secondary | ICD-10-CM | POA: Diagnosis not present

## 2021-02-25 DIAGNOSIS — R0602 Shortness of breath: Secondary | ICD-10-CM | POA: Diagnosis not present

## 2021-02-25 DIAGNOSIS — N181 Chronic kidney disease, stage 1: Secondary | ICD-10-CM | POA: Diagnosis not present

## 2021-02-25 LAB — COMPREHENSIVE METABOLIC PANEL
ALT: 21 U/L (ref 0–44)
AST: 17 U/L (ref 15–41)
Albumin: 4.4 g/dL (ref 3.5–5.0)
Alkaline Phosphatase: 61 U/L (ref 38–126)
Anion gap: 7 (ref 5–15)
BUN: 17 mg/dL (ref 6–20)
CO2: 23 mmol/L (ref 22–32)
Calcium: 9.2 mg/dL (ref 8.9–10.3)
Chloride: 106 mmol/L (ref 98–111)
Creatinine, Ser: 0.95 mg/dL (ref 0.61–1.24)
GFR, Estimated: >60 mL/min (ref >60)
Glucose, Bld: 186 mg/dL — ABNORMAL HIGH (ref 70–99)
Potassium: 4.1 mmol/L (ref 3.5–5.1)
Sodium: 136 mmol/L (ref 135–145)
Total Bilirubin: 1.1 mg/dL (ref 0.3–1.2)
Total Protein: 7.8 g/dL (ref 6.5–8.1)

## 2021-02-25 LAB — CBC WITH DIFFERENTIAL/PLATELET
Abs Immature Granulocytes: 0.02 10*3/uL (ref 0.00–0.07)
Basophils Absolute: 0 10*3/uL (ref 0.0–0.1)
Basophils Relative: 1 %
Eosinophils Absolute: 0.1 10*3/uL (ref 0.0–0.5)
Eosinophils Relative: 1 %
HCT: 45.7 % (ref 39.0–52.0)
Hemoglobin: 15.9 g/dL (ref 13.0–17.0)
Immature Granulocytes: 0 %
Lymphocytes Relative: 34 %
Lymphs Abs: 2.3 10*3/uL (ref 0.7–4.0)
MCH: 30.2 pg (ref 26.0–34.0)
MCHC: 34.8 g/dL (ref 30.0–36.0)
MCV: 86.7 fL (ref 80.0–100.0)
Monocytes Absolute: 0.4 10*3/uL (ref 0.1–1.0)
Monocytes Relative: 7 %
Neutro Abs: 4 10*3/uL (ref 1.7–7.7)
Neutrophils Relative %: 57 %
Platelets: 264 10*3/uL (ref 150–400)
RBC: 5.27 MIL/uL (ref 4.22–5.81)
RDW: 13.1 % (ref 11.5–15.5)
WBC: 6.8 10*3/uL (ref 4.0–10.5)
nRBC: 0 % (ref 0.0–0.2)

## 2021-02-25 LAB — RESP PANEL BY RT-PCR (FLU A&B, COVID) ARPGX2
Influenza A by PCR: NEGATIVE
Influenza B by PCR: NEGATIVE
SARS Coronavirus 2 by RT PCR: NEGATIVE

## 2021-02-25 LAB — CBG MONITORING, ED: Glucose-Capillary: 170 mg/dL — ABNORMAL HIGH (ref 70–99)

## 2021-02-25 LAB — TROPONIN I (HIGH SENSITIVITY): Troponin I (High Sensitivity): 3 ng/L (ref <18)

## 2021-02-25 NOTE — ED Triage Notes (Signed)
Pt to ED via POV with c/o HA, weakness and intermittent SOB x 2 weeks. Pt A&O x4, ambulatory to the desk without difficulty at this time.

## 2021-02-25 NOTE — ED Provider Notes (Signed)
[facility] Emergency Department Provider Note   ____________________________________________   Event Date/Time   First MD Initiated Contact with Patient 02/25/21 [phone]     (approximate)  I have reviewed the triage vital signs and the nursing notes.   HISTORY  Chief Complaint Headache and Weakness    HPI Keith Schmidt is a 45 y.o. male with the below stated past medical history who presents for shortness of breath and chronic daily headaches.  Patient states that these headaches occur from when he wakes up when he goes to bed and has no exacerbating leaving factors however it is a dull, 3/10, nonradiating global headache.  Patient states that he has tried ibuprofen/Tylenol and has improved for a short amount of time but always comes back.  Patient also states that he has been told by his girlfriend that he takes very shallow breaths and then occasionally has to take a very deep breath and that she is concerned.  Patient states that he is not very concerned about this.  Patient does not endorse snoring, apnea, or daytime drowsiness.  Patient currently denies any vision changes, tinnitus, difficulty speaking, facial droop, sore throat, chest pain, abdominal pain, nausea/vomiting/diarrhea, dysuria, or weakness/numbness/paresthesias in any extremity         Past Medical History:  Diagnosis Date  . Diabetes mellitus without complication (HCC)   . Hyperlipidemia     Patient Active Problem List   Diagnosis Date Noted  . Encounter for general adult medical examination without abnormal findings 12/31/2019  . HLD (hyperlipidemia) 05/19/2015  . 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 No pertinent surgical history.  Prior to Admission medications   Medication Sig Start Date End Date Taking? Authorizing Provider  glipiZIDE (GLUCOTROL) 10 MG tablet Take 1 tablet (10 mg total) by  mouth 2 (two) times daily before a meal. Patient not taking: Reported on 01/06/2020 01/22/17   Particia Nearing, PA-C  insulin detemir (LEVEMIR) 100 UNIT/ML injection Inject into the skin.    [provider]  metFORMIN (GLUCOPHAGE) 1000 MG tablet Take 1 tablet (1,000 mg total) by mouth 2 (two) times daily with a meal. 01/22/17   Particia Nearing, PA-C  multivitamin (ONE-A-DAY MEN'S) TABS tablet Take by mouth.    [provider]  pantoprazole (PROTONIX) 20 MG tablet Take 1 tablet (20 mg total) by mouth daily. 12/12/18 12/12/19  Jene Every, MD    Allergies Atorvastatin  Family History  Problem Relation Age of Onset  . Diabetes Mother   . Depression Maternal Aunt   . Depression Maternal Uncle   . Depression Maternal Grandmother   . Arthritis Maternal Grandmother   . Birth defects Maternal Grandmother        Pancreatic  . Heart disease Maternal Grandfather   . Depression Paternal Grandmother   . Arthritis Paternal Grandmother     Social History Social History   Tobacco Use  . Smoking status: Never Smoker  . Smokeless tobacco: Never Used  Substance Use Topics  . Alcohol use: No    Alcohol/week: 0.0 standard drinks  . Drug use: No    Review of Systems Constitutional: No fever/chills Eyes: No visual changes. ENT: No sore throat. Cardiovascular: Denies chest pain. Respiratory: Endorses shortness of breath. Gastrointestinal: No abdominal pain.  No nausea, no vomiting.  No diarrhea. Genitourinary: Negative for dysuria. Musculoskeletal: Negative for acute arthralgias Skin: Negative for rash.  Neurological: Positive for chronic daily headaches, denies weakness/numbness/paresthesias in any extremity Psychiatric: Negative for suicidal ideation/homicidal ideation   ____________________________________________   PHYSICAL EXAM:  VITAL SIGNS: ED Triage Vitals  Enc Vitals Group     BP 02/25/21 0759 (!) 131/93     Pulse Rate 02/25/21 0759 67     Resp  02/25/21 0759 18     Temp 02/25/21 0759 98.5 F (36.9 C)     Temp Source 02/25/21 0759 Oral     SpO2 02/25/21 0759 99 %     Weight 02/25/21 0752 175 lb (79.4 kg)     Height 02/25/21 0752 5\' 8"  (1.727 m)     Head Circumference --      Peak Flow --      Pain Score 02/25/21 0752 6     Pain Loc --      Pain Edu? --      Excl. in GC? --    Constitutional: Alert and oriented. Well appearing and in no acute distress. Eyes: Conjunctivae are normal. PERRL. Head: Atraumatic. Nose: No congestion/rhinnorhea. Mouth/Throat: Mucous membranes are moist. Neck: No stridor Cardiovascular: Grossly normal heart sounds.  Good peripheral circulation. Respiratory: Normal respiratory effort.  No retractions. Gastrointestinal: Soft and nontender. No distention. Musculoskeletal: No obvious deformities Neurologic:  Normal speech and language. No gross focal neurologic deficits are appreciated. Skin:  Skin is warm and dry. No rash noted. Psychiatric: Mood and affect are normal. Speech and behavior are normal.  ____________________________________________   LABS (all labs ordered are listed, but only abnormal results are displayed)  Labs Reviewed  COMPREHENSIVE METABOLIC PANEL - Abnormal; Notable for the following components:      Result Value   Glucose, Bld 186 (*)    All other components within normal limits  CBG MONITORING, ED - Abnormal; Notable for the following components:   Glucose-Capillary 170 (*)    All other components within normal limits  RESP PANEL BY RT-PCR (FLU A&B, COVID) ARPGX2  CBC WITH DIFFERENTIAL/PLATELET  TROPONIN I (HIGH SENSITIVITY)  TROPONIN I (HIGH SENSITIVITY)   ____________________________________________  EKG  ED ECG REPORT I, 02/27/21, the attending physician, personally viewed and interpreted this ECG.  Date: 02/25/2021 EKG Time: 0759 Rate: 67 Rhythm: normal sinus rhythm QRS Axis: normal Intervals: normal ST/T Wave abnormalities: normal Narrative  Interpretation: no evidence of acute ischemia  ____________________________________________  RADIOLOGY  ED MD interpretation: One-view portable chest x-ray shows no evidence of acute abnormalities including no pneumonia, pneumothorax, or widened mediastinum  Official radiology report(s): DG Chest 1 View  Result Date: 02/25/2021 CLINICAL DATA:  Shortness of breath Weakness Headache EXAM: CHEST  1 VIEW COMPARISON:  08/09/2020 FINDINGS: The heart size and mediastinal contours are within normal limits. Both lungs are clear. The visualized skeletal structures are unremarkable. IMPRESSION: No active disease. Electronically Signed   By: 13/10/2019 M.D.   On: 02/25/2021 08:24    ____________________________________________   PROCEDURES  Procedure(s) performed (including Critical Care):  .1-3 Lead EKG Interpretation Performed by: 02/27/2021, MD Authorized by: Merwyn Katos, MD     Interpretation: normal     ECG rate:  78   ECG rate assessment: normal     Rhythm: sinus rhythm     Ectopy: none     Conduction: normal       ____________________________________________   INITIAL IMPRESSION / ASSESSMENT AND PLAN / ED COURSE  As part of my medical decision making, I reviewed the following data within the electronic medical  record:  Nursing notes reviewed and incorporated, Labs reviewed, EKG interpreted, Old chart reviewed, Radiograph reviewed and Notes from prior ED visits reviewed and incorporated      The patient is suffering from shortness of breath, but the immediate cause is not apparent.  Potential causes considered include, but are not limited to, asthma or COPD, congestive heart failure, pulmonary embolism, pneumothorax, coronary syndrome, pneumonia, and pleural effusion.  Despite the evaluation including history, exam, and testing, the cause of the shortness of breath remains unclear. However, during the ED stay, patients condition improved, and at the time of  discharge the shortness of breath is resolved, they are feeling well, and want to go home. Possibly due to persistent hypoventilation and this may also be causing his chronic daily headaches Patient will be discharged with strict return precautions and advice to follow up with primary MD within 24 hours for further evaluation.      ____________________________________________   FINAL CLINICAL IMPRESSION(S) / ED DIAGNOSES  Final diagnoses:  Shortness of breath  Chronic daily headache     ED Discharge Orders    None       Note:  This document was prepared using Dragon voice recognition software and may include unintentional dictation errors.   Merwyn Katos, MD 02/25/21 [phone]

## 2021-02-25 NOTE — ED Notes (Signed)
Pt stated that he has had a headache for the past week and states there was some blurry vision and fatigue; states he thinks it was just related to his diabetes; CBG this morning at home was 180; took it now was 170; states the headache is better than it was no blurry vision today

## 2021-10-28 ENCOUNTER — Telehealth: Payer: Self-pay | Admitting: Pharmacy Technician

## 2021-10-28 ENCOUNTER — Other Ambulatory Visit: Payer: Self-pay

## 2021-10-28 NOTE — Telephone Encounter (Signed)
Provided patient with new patient packet to obtain [facility] services.  [facility] must receive requested financial documentation within 30 days in order to determine eligibility and provide medication assistance.  Hadlei Stitt J. Mattilyn Crites Care Manager [facility]  

## 2022-09-07 IMAGING — DX DG CHEST 1V
1 series · 1 of 1 positions shown · non-contrast
Comparison: 08/09/2020

CLINICAL DATA: Shortness of breath

Weakness
Headache
EXAM:
CHEST  1 VIEW

[chest ap]
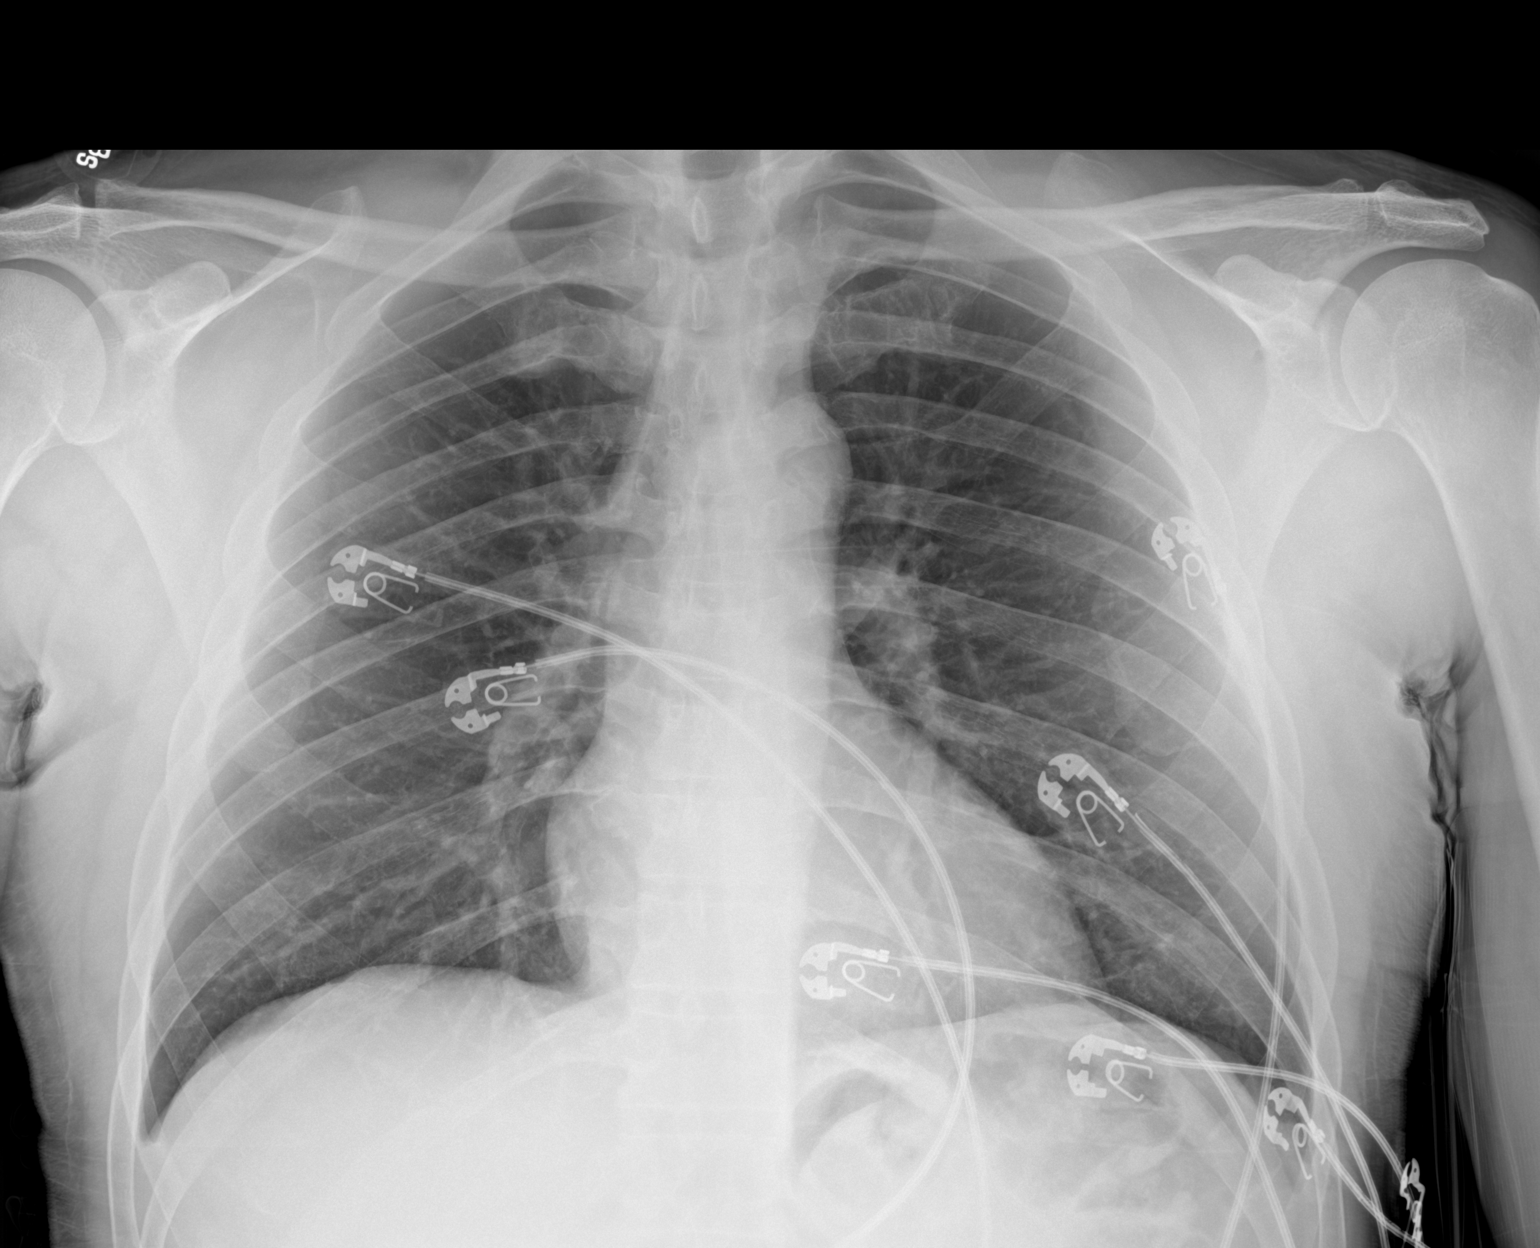

[1 of 1 positions shown; findings below may reference images not displayed]

FINDINGS: The heart size and mediastinal contours are within normal limits.
Both lungs are clear. The visualized skeletal structures are
unremarkable.
IMPRESSION: No active disease.

## 2023-04-10 ENCOUNTER — Ambulatory Visit
Admission: RE | Admit: 2023-04-10 | Discharge: 2023-04-10 | Disposition: A | Discharge status: Home / Self Care | Payer: BC Managed Care – PPO | Source: Ambulatory Visit

## 2023-04-10 VITALS — BP 132/85 | HR 82 | Temp 98.6°F | Resp 16 | Ht 68.0 in | Wt 181.0 lb

## 2023-04-10 DIAGNOSIS — H00014 Hordeolum externum left upper eyelid: Secondary | ICD-10-CM | POA: Diagnosis not present

## 2023-04-10 MED ORDER — OFLOXACIN 0.3 % OP SOLN
1.0000 [drp] | Freq: Four times a day (QID) | OPHTHALMIC | 0 refills | Status: AC
Start: 2023-04-10 — End: 2023-04-17

## 2023-04-10 NOTE — Discharge Instructions (Signed)
Start ofloxacin antibiotic eye drops to the left eye 4 times a day for one week. Warm compresses to the eye as needed.  Please follow-up with your PCP if your symptoms do not improve.  Please go to the ER for any worsening symptoms.  Hope you feel better soon!

## 2023-04-10 NOTE — ED Triage Notes (Signed)
Pt c/o L eyelid swelling & pressure x5 days. States he was mowing the yard last Friday & was bit by a yellow jacket the same day. At night eye drains.

## 2023-04-10 NOTE — ED Provider Notes (Signed)
[facility]    CSN: [id number] Arrival date & time: 04/10/23  1059      History   Chief Complaint Chief Complaint  Patient presents with   Eye Problem    Appt    HPI Keith Schmidt is a 47 y.o. male presents for evaluation of eyelid swelling.  Patient reports 5 days of left upper eyelid swelling with pressure and clear drainage.  He denies any injury to the eye, visual changes, URI/allergy symptoms.  No glasses or contacts.  He does state he was stung by a yellow jacket behind his left ear that day as well but denies any sting near the eye.  No fevers or chills.  No OTC medications have been used since onset.  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 No pertinent surgical history.     Home Medications    Prior to Admission medications   Medication Sig Start Date End Date Taking? Authorizing Provider  empagliflozin (JARDIANCE) 10 MG TABS tablet Take 1 tablet by mouth daily. 08/09/21  Yes [provider]  insulin detemir (LEVEMIR) 100 UNIT/ML injection Inject into the skin.   Yes [provider]  LANTUS SOLOSTAR 100 UNIT/ML Solostar Pen Inject into the skin. 11/02/22 05/01/23 Yes [provider]  lisinopril (ZESTRIL) 10 MG tablet Take 1 tablet by mouth daily. 03/27/23 03/26/24 Yes [provider]  metFORMIN (GLUCOPHAGE) 1000 MG tablet Take 1 tablet (1,000 mg total) by mouth 2 (two) times daily with a meal. 01/22/17  Yes Particia Nearing, PA-C  multivitamin (ONE-A-DAY MEN'S) TABS tablet Take by mouth.   Yes [provider]  ofloxacin (OCUFLOX) 0.3 % ophthalmic solution Place 1 drop into the left eye 4 (four) times daily for 7 days. 04/10/23 04/17/23 Yes Radford Pax, NP  rosuvastatin (CRESTOR) 10 MG tablet Take 1 tablet by mouth daily. 12/25/22  Yes [provider]  traMADol (ULTRAM) 50 MG tablet Take 50 mg by mouth every 6 (six) hours as needed. 12/18/22  Yes [provider]  glipiZIDE (GLUCOTROL) 10 MG tablet Take 1 tablet (10 mg total) by mouth 2 (two) times daily before a meal. Patient not taking: Reported on 01/06/2020 01/22/17   Particia Nearing, PA-C  pantoprazole (PROTONIX) 20 MG tablet Take 1 tablet (20 mg total) by mouth daily. 12/12/18 12/12/19  Jene Every, MD    Family History Family History  Problem Relation Age of Onset   Diabetes Mother    Depression Maternal Aunt    Depression Maternal Uncle    Depression Maternal Grandmother    Arthritis Maternal Grandmother    Birth defects Maternal Grandmother        Pancreatic   Heart disease Maternal Grandfather    Depression Paternal Grandmother    Arthritis Paternal Grandmother     Social History Social History   Tobacco Use   Smoking status: Never   Smokeless tobacco: Never  Substance Use Topics   Alcohol use: No    Alcohol/week: 0.0 standard drinks of alcohol   Drug use: No  Allergies   Atorvastatin   Review of Systems Review of Systems  Eyes:        Left upper eyelid swelling     Physical Exam Triage Vital Signs ED Triage Vitals  Enc Vitals Group     BP 04/10/23 1104 132/85     Pulse Rate 04/10/23 1104 82     Resp 04/10/23 1104 16     Temp 04/10/23 1104 98.6 F (37 C)     Temp Source 04/10/23 1104 Oral     SpO2 04/10/23 1104 98 %     Weight 04/10/23 1102 181 lb (82.1 kg)     Height 04/10/23 1102 5\' 8"  (1.727 m)     Head Circumference --      Peak Flow --      Pain Score 04/10/23 1106 8     Pain Loc --      Pain Edu? --      Excl. in GC? --    No data found.  Updated Vital  Signs BP 132/85 (BP Location: Left Arm)   Pulse 82   Temp 98.6 F (37 C) (Oral)   Resp 16   Ht 5\' 8"  (1.727 m)   Wt 181 lb (82.1 kg)   SpO2 98%   BMI 27.52 kg/m   Visual Acuity Right Eye Distance:   Left Eye Distance:   Bilateral Distance:    Right Eye Near:   Left Eye Near:    Bilateral Near:     Physical Exam Vitals and nursing note reviewed.  Constitutional:      General: He is not in acute distress.    Appearance: Normal appearance. He is not ill-appearing.  HENT:     Head: Normocephalic and atraumatic.  Eyes:     General:        Left eye: Hordeolum present.No foreign body or discharge.     Extraocular Movements: Extraocular movements intact.     Conjunctiva/sclera: Conjunctivae normal.     Left eye: Left conjunctiva is not injected. No chemosis, exudate or hemorrhage.    Pupils: Pupils are equal, round, and reactive to light.   Cardiovascular:     Rate and Rhythm: Normal rate.  Pulmonary:     Effort: Pulmonary effort is normal.  Skin:    General: Skin is warm and dry.  Neurological:     General: No focal deficit present.     Mental Status: He is alert and oriented to person, place, and time.  Psychiatric:        Mood and Affect: Mood normal.        Behavior: Behavior normal.      UC Treatments / Results  Labs (all labs ordered are listed, but only abnormal results are displayed) Labs Reviewed - No data to display  EKG   Radiology No results found.  Procedures Procedures (including critical care time)  Medications Ordered in UC Medications - No data to display  Initial Impression / Assessment and Plan / UC Course  I have reviewed the triage vital signs and the nursing notes.  Pertinent labs & imaging results that were available during my care of the patient were reviewed by me and considered in my medical decision making (see chart for details).     Start ofloxacin eyedrops and warm compresses.  PCP follow-up if symptoms do not improve.   ER precautions reviewed and patient verbalized understanding. Final Clinical Impressions(s) / UC Diagnoses   Final diagnoses:  Hordeolum externum of left upper  eyelid     Discharge Instructions      Start ofloxacin antibiotic eye drops to the left eye 4 times a day for one week. Warm compresses to the eye as needed.  Please follow-up with your PCP if your symptoms do not improve.  Please go to the ER for any worsening symptoms.  Hope you feel better soon!   ED Prescriptions     Medication Sig Dispense Auth. Provider   ofloxacin (OCUFLOX) 0.3 % ophthalmic solution Place 1 drop into the left eye 4 (four) times daily for 7 days. 5 mL Radford Pax, NP      PDMP not reviewed this encounter.   Radford Pax, NP 04/10/23 1125

## 2023-04-23 ENCOUNTER — Ambulatory Visit: Payer: BC Managed Care – PPO

## 2023-04-23 DIAGNOSIS — D122 Benign neoplasm of ascending colon: Secondary | ICD-10-CM | POA: Diagnosis not present

## 2023-04-23 DIAGNOSIS — Z1211 Encounter for screening for malignant neoplasm of colon: Secondary | ICD-10-CM | POA: Diagnosis not present

## 2023-10-23 ENCOUNTER — Ambulatory Visit
Admission: EM | Admit: 2023-10-23 | Discharge: 2023-10-23 | Disposition: A | Discharge status: Home / Self Care | Payer: BC Managed Care – PPO

## 2023-10-23 ENCOUNTER — Encounter: Payer: Self-pay | Admitting: Emergency Medicine

## 2023-10-23 DIAGNOSIS — R040 Epistaxis: Secondary | ICD-10-CM

## 2023-10-23 DIAGNOSIS — R519 Headache, unspecified: Secondary | ICD-10-CM | POA: Diagnosis not present

## 2023-10-23 NOTE — ED Triage Notes (Signed)
 Pt c/o headache and nosebleeds for the past 3 days. Pt states the nosebleeds do not last a long time. Pt denies any visual changes or nausea with the headaches. Pt has taken ibuprofen for the pain.

## 2023-10-23 NOTE — Discharge Instructions (Signed)
-  Take ibuprofen and Tylenol  for your headache. - Flush your nose with nasal saline and apply Vaseline to the inside of your nose.  I think it may be getting dry and leading to the nosebleeds. - If you have frequent, heavy nosebleeds or whether difficult to stop (last longer than 10 to 15 minutes), worsening headaches, dizziness, weakness, feeling faint or passing out, confusion, facial numbness/drooping, chest pain, racing heart, shortness of breath, etc. please go to the emergency department. - If you are troubled by the symptoms not going away in the next few days follow-up with your primary care provider.

## 2023-10-23 NOTE — ED Provider Notes (Signed)
 [facility]    CSN: [id number] Arrival date & time: 10/23/23  1541      History   Chief Complaint Chief Complaint  Patient presents with   Headache   Epistaxis    HPI Keith Schmidt is a 48 y.o. male with history of insulin  dependent diabetes mellitus, hyperlipidemia, hypertension, and erectile dysfunction.   Patient presents today for 3 day history of mild/moderate headaches and mild nosebleeds that last 1-2 min and stop with direct pressure.  Denies any falls or injuries.  Denies vision changes, dizziness, confusion, facial weakness or drooping, presyncope/syncope, chest pain, palpitations, shortness of breath, neck pain, bleeding from other sites. No congestion, cough, sore throat or sinus pressure. This is not the worst headache he has ever had. Has had mild nosebleeds in the past.  Has taken ibuprofen with improvement in headache.  Patient is not on any anticoagulant medications. No history of bleeding or coagulation problems.  No history of migraines or headaches.  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 No pertinent surgical history.     Home Medications    Prior to Admission medications   Medication Sig Start Date End Date Taking? Authorizing Provider  OZEMPIC, 2 MG/DOSE, 8 MG/3ML SOPN Inject 2 mg subcutaneous once weekly 10/19/23  Yes [provider]  sildenafil (REVATIO) 20 MG tablet 1-5 tabs a day as needled prior to sexual activity 05/26/22  Yes [provider]  empagliflozin (JARDIANCE) 10 MG TABS tablet Take 1 tablet by mouth daily. 08/09/21   [provider]  glipiZIDE  (GLUCOTROL ) 10 MG tablet Take 1 tablet (10 mg total) by mouth 2 (two) times daily before a meal. Patient not taking: Reported on 01/06/2020 01/22/17   Stuart Vernell Norris, PA-C  insulin  detemir (LEVEMIR) 100 UNIT/ML injection Inject into the skin.    [provider]  LANTUS SOLOSTAR 100 UNIT/ML Solostar Pen Inject into the skin. 11/02/22 05/01/23  [provider]  lisinopril (ZESTRIL) 10 MG tablet Take 1 tablet by mouth daily. 03/27/23 03/26/24  [provider]  metFORMIN  (GLUCOPHAGE ) 1000 MG tablet Take 1 tablet (1,000 mg total) by mouth 2 (two) times daily with a meal. 01/22/17   Stuart Vernell Norris, PA-C  multivitamin (ONE-A-DAY MEN'S) TABS tablet Take by mouth.    [provider]  pantoprazole  (PROTONIX ) 20 MG tablet Take 1 tablet (20 mg total) by mouth daily. 12/12/18 12/12/19  Arlander Charleston, MD  rosuvastatin (CRESTOR) 10 MG tablet Take 1 tablet by mouth daily. 12/25/22   [provider]  traMADol (ULTRAM) 50 MG tablet Take 50 mg by mouth every 6 (six) hours as needed. 12/18/22   [provider]    Family History Family History  Problem Relation Age of Onset   Diabetes Mother    Depression Maternal Aunt    Depression Maternal Uncle    Depression Maternal Grandmother    Arthritis Maternal Grandmother    Birth defects Maternal Grandmother        Pancreatic   Heart disease Maternal Grandfather    Depression Paternal Grandmother    Arthritis Paternal Grandmother  Social History Social History   Tobacco Use   Smoking status: Never   Smokeless tobacco: Never  Vaping Use   Vaping status: Never Used  Substance Use Topics   Alcohol use: No    Alcohol/week: 0.0 standard drinks of alcohol   Drug use: No     Allergies   Metformin  and Atorvastatin    Review of Systems Review of Systems  Constitutional:  Negative for fatigue and fever.  HENT:  Positive for nosebleeds. Negative for congestion,  rhinorrhea and sore throat.   Eyes:  Negative for photophobia and visual disturbance.  Respiratory:  Negative for cough and shortness of breath.   Cardiovascular:  Negative for chest pain and palpitations.  Gastrointestinal:  Negative for nausea and vomiting.  Musculoskeletal:  Negative for gait problem.  Skin:  Negative for wound.  Neurological:  Positive for headaches. Negative for dizziness, tremors, syncope, speech difficulty, weakness, light-headedness and numbness.  Psychiatric/Behavioral:  Negative for confusion.      Physical Exam Triage Vital Signs ED Triage Vitals  Encounter Vitals Group     BP 10/23/23 1558 124/81     Systolic BP Percentile --      Diastolic BP Percentile --      Pulse Rate 10/23/23 1558 89     Resp 10/23/23 1558 16     Temp 10/23/23 1558 98 F (36.7 C)     Temp Source 10/23/23 1558 Oral     SpO2 10/23/23 1558 98 %     Weight --      Height --      Head Circumference --      Peak Flow --      Pain Score 10/23/23 1556 5     Pain Loc --      Pain Education --      Exclude from Growth Chart --    No data found.  Updated Vital Signs BP 124/81 (BP Location: Left Arm)   Pulse 89   Temp 98 F (36.7 C) (Oral)   Resp 16   SpO2 98%      Physical Exam Vitals and nursing note reviewed.  Constitutional:      General: He is not in acute distress.    Appearance: Normal appearance. He is well-developed. He is not ill-appearing.  HENT:     Head: Normocephalic and atraumatic.     Nose: Congestion present.     Mouth/Throat:     Mouth: Mucous membranes are moist.     Pharynx: Oropharynx is clear.  Eyes:     General: No scleral icterus.    Conjunctiva/sclera: Conjunctivae normal.  Cardiovascular:     Rate and Rhythm: Normal rate and regular rhythm.     Heart sounds: Normal heart sounds.  Pulmonary:     Effort: Pulmonary effort is normal. No respiratory distress.     Breath sounds: Normal breath sounds.  Musculoskeletal:     Cervical back: Neck  supple.  Skin:    General: Skin is warm and dry.     Capillary Refill: Capillary refill takes less than 2 seconds.  Neurological:     General: No focal deficit present.     Mental Status: He is alert. Mental status is at baseline.     Motor: No weakness.     Gait: Gait normal.  Psychiatric:        Mood and Affect: Mood normal.        Behavior: Behavior normal.      UC Treatments / Results  Labs (all labs ordered are listed, but only abnormal results are displayed) Labs Reviewed - No data to display  EKG   Radiology No results found.  Procedures Procedures (including critical care time)  Medications Ordered in UC Medications - No data to display  Initial Impression / Assessment and Plan / UC Course  I have reviewed the triage vital signs and the nursing notes.  Pertinent labs & imaging results that were available during my care of the patient were reviewed by me and considered in my medical decision making (see chart for details).   48 year old male presents for 3-day history of mild to moderate headaches and mildnosebleeds that last for 1 to 2 minutes.  Has had 3 nosebleeds total.  Has had nosebleeds in the past.  No history of migraines but this is not the worst headache he is ever had.  Denies URI symptoms, dizziness, nausea/vomiting, facial drooping/numbness or weakness, presyncope/syncope, chest pain, palpitations or shortness of breath.  History of hypertension but takes medication and blood pressure is stable today at 124/81.  Other vitals normal and stable.  On exam he is overall well-appearing.  Very slight nasal congestion.  No active nosebleed.  Remainder of HEENT exam is normal.  Chest clear to auscultation heart regular rate and rhythm.  Explained to patient that there are no red flag signs or symptoms related to his complaints.  Suggested continuing ibuprofen and Tylenol  since it seems to help.  Offered him a Toradol injection he declined.  Advised nasal saline and  applying Vaseline to the inside of his nose.  Could be having nosebleeds related to the dry weather.  Reviewed thoroughly with him ED red flag signs and symptoms.   Final Clinical Impressions(s) / UC Diagnoses   Final diagnoses:  Acute nonintractable headache, unspecified headache type  Epistaxis     Discharge Instructions      -Take ibuprofen and Tylenol  for your headache. - Flush your nose with nasal saline and apply Vaseline to the inside of your nose.  I think it may be getting dry and leading to the nosebleeds. - If you have frequent, heavy nosebleeds or whether difficult to stop (last longer than 10 to 15 minutes), worsening headaches, dizziness, weakness, feeling faint or passing out, confusion, facial numbness/drooping, chest pain, racing heart, shortness of breath, etc. please go to the emergency department. - If you are troubled by the symptoms not going away in the next few days follow-up with your primary care provider.     ED Prescriptions   None    PDMP not reviewed this encounter.   Arvis Jolan NOVAK, PA-C 10/23/23 1645

## 2023-10-24 ENCOUNTER — Ambulatory Visit: Payer: BC Managed Care – PPO
